# Patient Record
Sex: Female | Born: 1965
Health system: Southern US, Community
[De-identification: ages and names within clinical notes are randomized; demographics above are authoritative.]

## PROBLEM LIST (undated history)

## (undated) DIAGNOSIS — F419 Anxiety disorder, unspecified: Secondary | ICD-10-CM

## (undated) DIAGNOSIS — S3992XA Unspecified injury of lower back, initial encounter: Secondary | ICD-10-CM

## (undated) DIAGNOSIS — F32A Depression, unspecified: Secondary | ICD-10-CM

## (undated) DIAGNOSIS — I499 Cardiac arrhythmia, unspecified: Secondary | ICD-10-CM

## (undated) DIAGNOSIS — E162 Hypoglycemia, unspecified: Secondary | ICD-10-CM

## (undated) HISTORY — PX: ABDOMINAL HYSTERECTOMY: SHX81

## (undated) HISTORY — PX: OTHER SURGICAL HISTORY: SHX169

## (undated) HISTORY — PX: COLECTOMY: SHX59

---

## 1997-11-03 ENCOUNTER — Ambulatory Visit (HOSPITAL_COMMUNITY): Admission: RE | Admit: 1997-11-03 | Discharge: 1997-11-03 | Payer: Self-pay | Admitting: Family Medicine

## 1998-01-06 ENCOUNTER — Emergency Department (HOSPITAL_COMMUNITY): Admission: EM | Admit: 1998-01-06 | Discharge: 1998-01-06 | Payer: Self-pay | Admitting: Emergency Medicine

## 1998-01-14 ENCOUNTER — Ambulatory Visit (HOSPITAL_COMMUNITY): Admission: RE | Admit: 1998-01-14 | Discharge: 1998-01-14 | Payer: Self-pay | Admitting: *Deleted

## 1998-01-28 ENCOUNTER — Inpatient Hospital Stay (HOSPITAL_COMMUNITY): Admission: RE | Admit: 1998-01-28 | Discharge: 1998-01-31 | Payer: Self-pay | Admitting: *Deleted

## 1999-09-21 ENCOUNTER — Encounter: Payer: Self-pay | Admitting: *Deleted

## 1999-09-21 ENCOUNTER — Encounter: Admission: RE | Admit: 1999-09-21 | Discharge: 1999-09-21 | Payer: Self-pay | Admitting: *Deleted

## 2000-08-05 ENCOUNTER — Encounter: Payer: Self-pay | Admitting: Emergency Medicine

## 2000-08-05 ENCOUNTER — Emergency Department (HOSPITAL_COMMUNITY): Admission: EM | Admit: 2000-08-05 | Discharge: 2000-08-05 | Payer: Self-pay | Admitting: Emergency Medicine

## 2000-10-31 ENCOUNTER — Encounter: Payer: Self-pay | Admitting: Internal Medicine

## 2000-10-31 ENCOUNTER — Encounter: Admission: RE | Admit: 2000-10-31 | Discharge: 2000-10-31 | Payer: Self-pay | Admitting: Internal Medicine

## 2004-07-05 ENCOUNTER — Emergency Department (HOSPITAL_COMMUNITY): Admission: EM | Admit: 2004-07-05 | Discharge: 2004-07-06 | Payer: Self-pay | Admitting: Emergency Medicine

## 2007-10-07 ENCOUNTER — Emergency Department (HOSPITAL_COMMUNITY): Admission: EM | Admit: 2007-10-07 | Discharge: 2007-10-07 | Payer: Self-pay | Admitting: Emergency Medicine

## 2007-10-09 ENCOUNTER — Emergency Department (HOSPITAL_COMMUNITY): Admission: EM | Admit: 2007-10-09 | Discharge: 2007-10-09 | Payer: Self-pay | Admitting: Emergency Medicine

## 2008-05-26 ENCOUNTER — Emergency Department (HOSPITAL_COMMUNITY): Admission: EM | Admit: 2008-05-26 | Discharge: 2008-05-26 | Payer: Self-pay | Admitting: Family Medicine

## 2008-08-31 ENCOUNTER — Ambulatory Visit: Payer: Self-pay | Admitting: Radiology

## 2008-08-31 ENCOUNTER — Emergency Department (HOSPITAL_BASED_OUTPATIENT_CLINIC_OR_DEPARTMENT_OTHER): Admission: EM | Admit: 2008-08-31 | Discharge: 2008-08-31 | Payer: Self-pay | Admitting: Emergency Medicine

## 2008-10-16 ENCOUNTER — Emergency Department (HOSPITAL_COMMUNITY): Admission: EM | Admit: 2008-10-16 | Discharge: 2008-10-16 | Payer: Self-pay | Admitting: Emergency Medicine

## 2009-01-28 ENCOUNTER — Emergency Department (HOSPITAL_COMMUNITY): Admission: EM | Admit: 2009-01-28 | Discharge: 2009-01-28 | Payer: Self-pay | Admitting: Family Medicine

## 2009-06-01 ENCOUNTER — Encounter: Admission: RE | Admit: 2009-06-01 | Discharge: 2009-06-01 | Payer: Self-pay | Admitting: Specialist

## 2009-07-18 ENCOUNTER — Emergency Department (HOSPITAL_BASED_OUTPATIENT_CLINIC_OR_DEPARTMENT_OTHER): Admission: EM | Admit: 2009-07-18 | Discharge: 2009-07-18 | Payer: Self-pay | Admitting: Emergency Medicine

## 2010-07-10 IMAGING — CR DG CHEST 2V
2 series · 2 of 2 positions shown · non-contrast
Comparison: No comparison studies available.

CLINICAL DATA: Status and cough.  Fever.

CHEST - 2 VIEW

[view not recorded (1 of 2)]
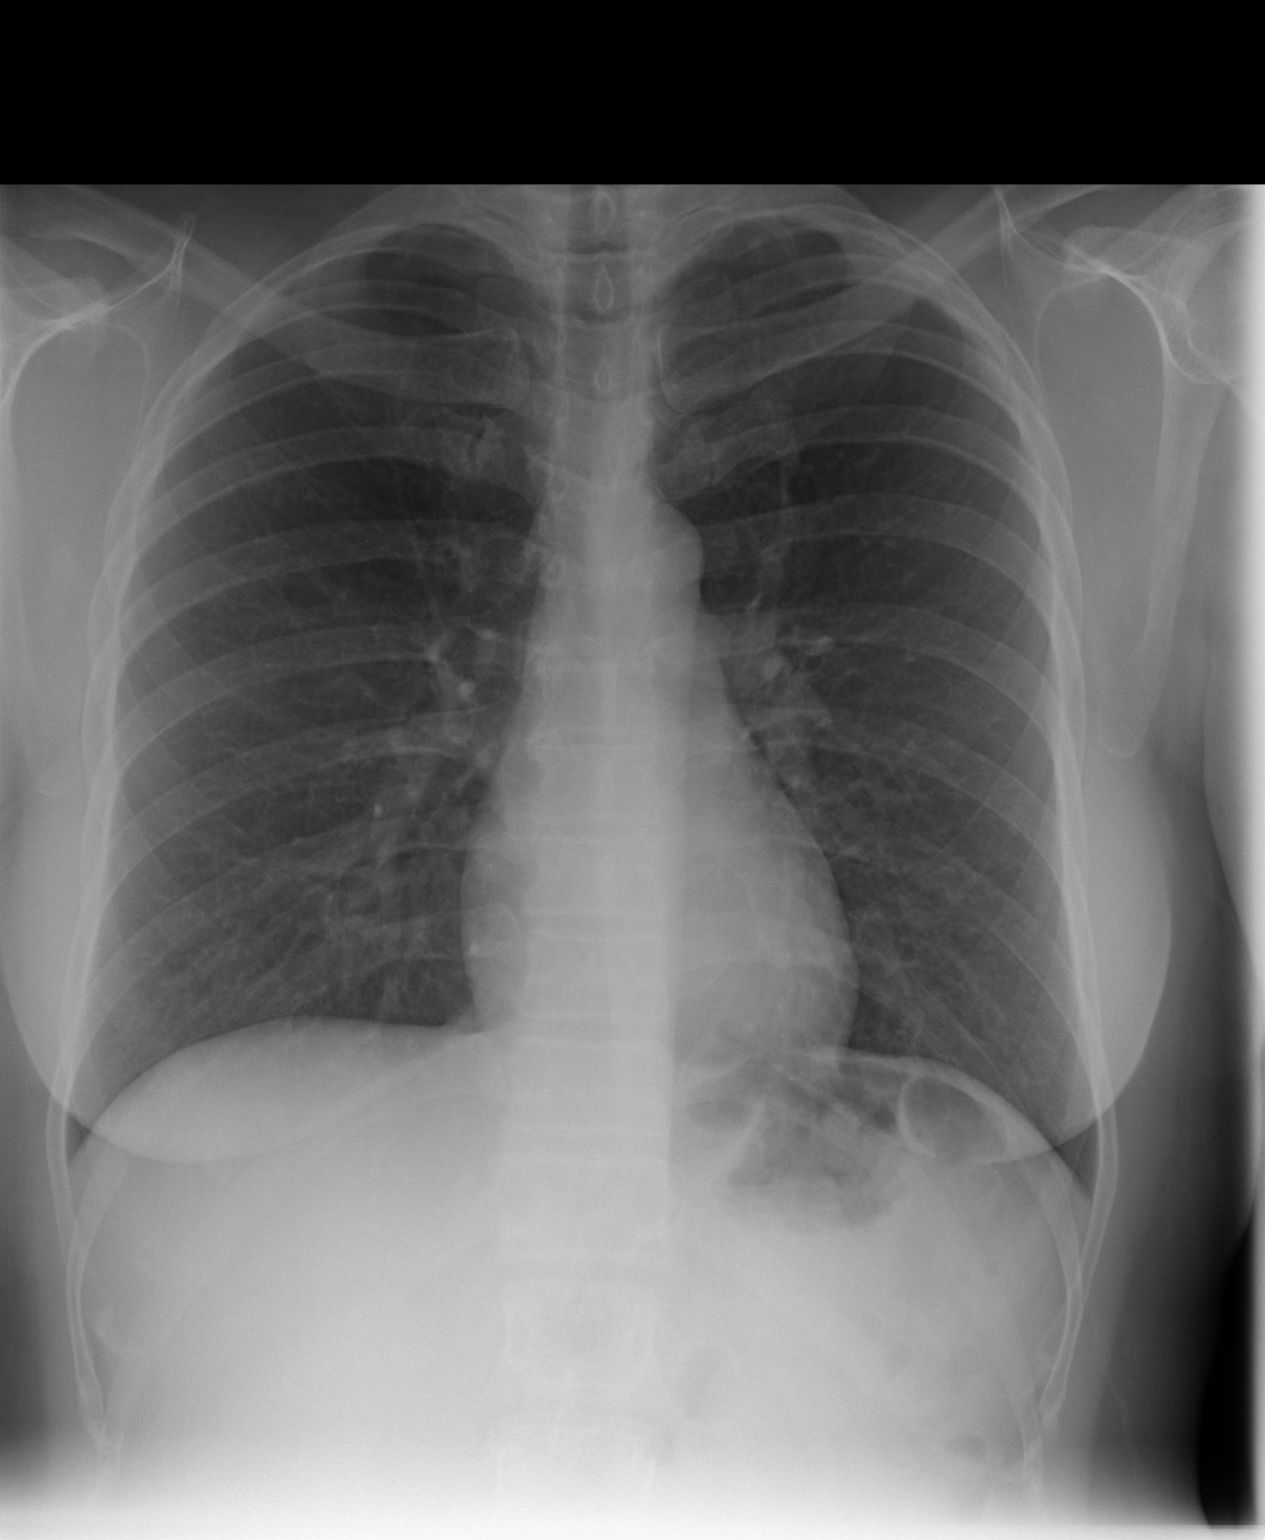

[view not recorded (2 of 2)]
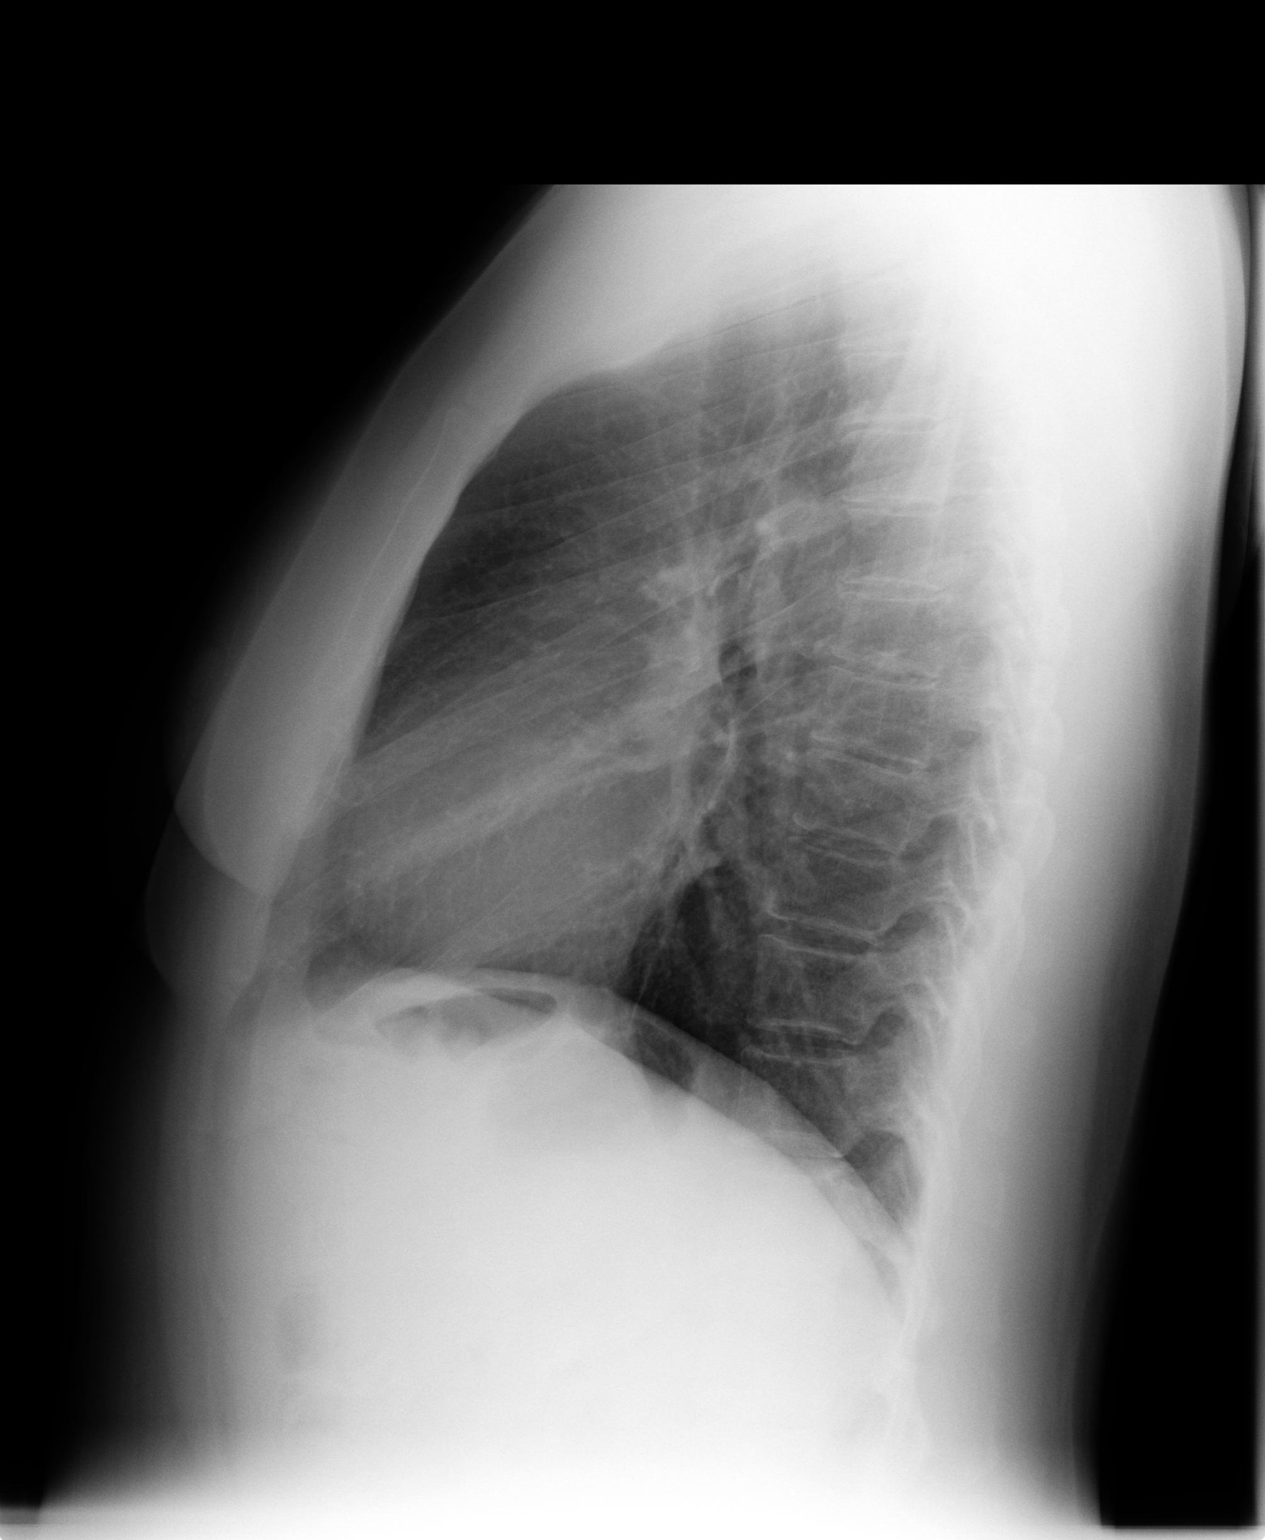

[2 of 2 positions shown; findings below may reference images not displayed]

FINDINGS: The lungs are clear without focal consolidation, edema,
effusion or pneumothorax.  Cardiopericardial silhouette is within
normal limits for size.  Imaged bony structures of the thorax are
intact. Tiny cervical ribs are seen bilaterally at C7.
IMPRESSION: No acute cardiopulmonary process.

## 2010-10-25 LAB — GC/CHLAMYDIA PROBE AMP, GENITAL
Chlamydia, DNA Probe: NEGATIVE
GC Probe Amp, Genital: NEGATIVE

## 2010-10-25 LAB — URINE CULTURE

## 2010-10-25 LAB — WET PREP, GENITAL
Clue Cells Wet Prep HPF POC: NONE SEEN
Trich, Wet Prep: NONE SEEN

## 2010-10-25 LAB — POCT URINALYSIS DIP (DEVICE)
Ketones, ur: NEGATIVE mg/dL
Protein, ur: NEGATIVE mg/dL
Specific Gravity, Urine: 1.02 (ref 1.005–1.030)

## 2010-10-28 ENCOUNTER — Emergency Department (INDEPENDENT_AMBULATORY_CARE_PROVIDER_SITE_OTHER): Payer: BLUE CROSS/BLUE SHIELD

## 2010-10-28 ENCOUNTER — Emergency Department (HOSPITAL_BASED_OUTPATIENT_CLINIC_OR_DEPARTMENT_OTHER)
Admission: EM | Admit: 2010-10-28 | Discharge: 2010-10-28 | Disposition: A | Discharge status: Home / Self Care | Payer: BLUE CROSS/BLUE SHIELD | Attending: Emergency Medicine | Admitting: Emergency Medicine

## 2010-10-28 DIAGNOSIS — G8929 Other chronic pain: Secondary | ICD-10-CM | POA: Insufficient documentation

## 2010-10-28 DIAGNOSIS — W19XXXA Unspecified fall, initial encounter: Secondary | ICD-10-CM

## 2010-10-28 DIAGNOSIS — M25519 Pain in unspecified shoulder: Secondary | ICD-10-CM | POA: Insufficient documentation

## 2010-11-02 LAB — DIFFERENTIAL
Eosinophils Absolute: 0.1 10*3/uL (ref 0.0–0.7)
Eosinophils Relative: 1 % (ref 0–5)
Lymphs Abs: 2.6 10*3/uL (ref 0.7–4.0)
Monocytes Absolute: 0.4 10*3/uL (ref 0.1–1.0)
Monocytes Relative: 6 % (ref 3–12)

## 2010-11-02 LAB — BASIC METABOLIC PANEL
Chloride: 106 mEq/L (ref 96–112)
GFR calc Af Amer: >60 mL/min (ref >60)
Potassium: 4.1 mEq/L (ref 3.5–5.1)

## 2010-11-02 LAB — CBC
HCT: 35.8 % — ABNORMAL LOW (ref 36.0–46.0)
MCV: 92.5 fL (ref 78.0–100.0)
RBC: 3.87 MIL/uL (ref 3.87–5.11)
WBC: 6.9 10*3/uL (ref 4.0–10.5)

## 2011-03-22 ENCOUNTER — Encounter: Payer: Self-pay | Admitting: *Deleted

## 2011-03-22 ENCOUNTER — Emergency Department (HOSPITAL_BASED_OUTPATIENT_CLINIC_OR_DEPARTMENT_OTHER)
Admission: EM | Admit: 2011-03-22 | Discharge: 2011-03-23 | Disposition: A | Discharge status: Home / Self Care | Payer: Worker's Compensation | Attending: Emergency Medicine | Admitting: Emergency Medicine

## 2011-03-22 DIAGNOSIS — G8929 Other chronic pain: Secondary | ICD-10-CM | POA: Insufficient documentation

## 2011-03-22 DIAGNOSIS — M549 Dorsalgia, unspecified: Secondary | ICD-10-CM | POA: Insufficient documentation

## 2011-03-22 HISTORY — DX: Unspecified injury of lower back, initial encounter: S39.92XA

## 2011-03-22 MED ORDER — KETOROLAC TROMETHAMINE 30 MG/ML IJ SOLN
60.0000 mg | Freq: Once | INTRAMUSCULAR | Status: AC
  Administered 2011-03-22: 60 mg via INTRAMUSCULAR
  Filled 2011-03-22: qty 2

## 2011-03-22 MED ORDER — DIAZEPAM 5 MG/ML IJ SOLN
10.0000 mg | Freq: Once | INTRAMUSCULAR | Status: DC
  Filled 2011-03-22: qty 2

## 2011-03-22 MED ORDER — HYDROMORPHONE HCL 2 MG/ML IJ SOLN
2.0000 mg | Freq: Once | INTRAMUSCULAR | Status: AC
  Administered 2011-03-22: 2 mg via INTRAMUSCULAR
  Filled 2011-03-22: qty 1

## 2011-03-22 NOTE — ED Notes (Signed)
Patient given ice chips with permission from assigned RN

## 2011-03-22 NOTE — ED Notes (Addendum)
Pt c/o back pain after lifting laundry basket today, tramadol not helping with pain. Pt has previous worker's comp back injury which she is seeing a spinal MD for but re-injured it again today. Denies incontinence, able to move legs

## 2011-03-22 NOTE — ED Provider Notes (Addendum)
History     CSN: [id number] Arrival date & time: 03/22/2011  8:56 PM  Chief Complaint  Patient presents with  . Back Pain   HPI Pt with history of chronic back pain from fall at work several months ago reports increased pain today after lifting and twisting with a laundry basket. Complaining of severe aching bilateral low back pain, worse with movement. No radiation into legs, no incontinence, no falls or trauma.  Past Medical History  Diagnosis Date  . Back injury     History reviewed. No pertinent past surgical history.  History reviewed. No pertinent family history.  History  Substance Use Topics  . Smoking status: Never Smoker   . Smokeless tobacco: Not on file  . Alcohol Use: No    OB History    Grav Para Term Preterm Abortions TAB SAB Ect Mult Living                  Review of Systems All other systems reviewed and are negative except as noted in HPI.   Physical Exam  BP 127/85  Pulse 99  Temp(Src) 98.7 F (37.1 C) (Oral)  Resp 22  Ht 5\' 8"  (1.727 m)  Wt 220 lb (99.791 kg)  BMI 33.45 kg/m2  SpO2 100%  Physical Exam  Nursing note and vitals reviewed. Constitutional: She is oriented to person, place, and time. She appears well-developed and well-nourished.  HENT:  Head: Normocephalic and atraumatic.  Eyes: EOM are normal. Pupils are equal, round, and reactive to light.  Neck: Normal range of motion. Neck supple.  Cardiovascular: Normal rate, normal heart sounds and intact distal pulses.   Pulmonary/Chest: Effort normal and breath sounds normal.  Abdominal: Bowel sounds are normal. She exhibits no distension. There is no tenderness.  Musculoskeletal: Normal range of motion. She exhibits no edema.       Lumbar paraspinal tenderness/spasm  Neurological: She is alert and oriented to person, place, and time. No cranial nerve deficit.  Skin: Skin is warm and dry. No rash noted.  Psychiatric: She has a normal mood and affect.    ED Course   Procedures  MDM IM meds for pain then will reassess.     12:02 AM Pain improved some, but patient is very tense and not relaxing. Advised her to relax and lay back to help muscle spasm. Offered valium for additional relief, but family is concerned about further sedating her. Will d/c with family at bedside and advised to rest and continue pain medications at home.   Charles B. Bernette Mayers, MD 03/23/11 0003  Addendum: Pt unable to sit up without getting nauseated and diaphoretic. She was given PO Zofran and allowed to stay in the ED for several hours, but still vomiting with sitting/standing. Will place IV, give IV meds and fluids.   Charles B. Bernette Mayers, MD 03/23/11 [phone]

## 2011-03-23 MED ORDER — SODIUM CHLORIDE 0.9 % IV SOLN
Freq: Once | INTRAVENOUS | Status: AC
  Administered 2011-03-23: 04:00:00 via INTRAVENOUS

## 2011-03-23 MED ORDER — OXYCODONE-ACETAMINOPHEN 5-325 MG PO TABS: 1.0000 | ORAL_TABLET | Freq: Four times a day (QID) | ORAL | Status: AC | PRN

## 2011-03-23 MED ORDER — ONDANSETRON 8 MG PO TBDP
8.0000 mg | ORAL_TABLET | Freq: Once | ORAL | Status: AC
Start: 2011-03-23 — End: 2011-03-23
  Administered 2011-03-23: 8 mg via ORAL

## 2011-03-23 MED ORDER — CYCLOBENZAPRINE HCL 10 MG PO TABS: 10.0000 mg | ORAL_TABLET | Freq: Two times a day (BID) | ORAL | Status: AC | PRN

## 2011-03-23 MED ORDER — IBUPROFEN 800 MG PO TABS: 800.0000 mg | ORAL_TABLET | Freq: Three times a day (TID) | ORAL | Status: AC

## 2011-03-23 MED ORDER — ONDANSETRON 8 MG PO TBDP
ORAL_TABLET | ORAL | Status: AC
  Administered 2011-03-23: 8 mg
  Filled 2011-03-23: qty 1

## 2011-03-23 MED ORDER — ONDANSETRON HCL 4 MG/2ML IJ SOLN
INTRAMUSCULAR | Status: AC
  Administered 2011-03-23: 4 mg via INTRAVENOUS
  Filled 2011-03-23: qty 2

## 2011-03-23 NOTE — ED Notes (Signed)
patietns family member to nurses station asking to help get patient in a wheelchair to go home. Assigned RN advised she could leave if she could sit up without vomiting and diaphoresis. When patient say up to get out of bed, she became slightly diaphoretic and began to vomit. RN witnessed. Patient advised and agrees to lay back down. Assigned RN aware.

## 2011-03-23 NOTE — ED Notes (Signed)
No further vomiting noted, pt placed in w/c and placed in vehicle with family for transport home.

## 2011-03-23 NOTE — ED Notes (Signed)
Pt was only given one dose of 8mg  Zofran ODT per MD order.

## 2011-03-23 NOTE — ED Notes (Signed)
Upon reassessment of pt, pt is no longer c/o tremendous pain. Pt is slurring her words and son requests that no more meds be given since she has to stay at home alone. MD made aware.

## 2011-03-23 NOTE — ED Notes (Signed)
Upon sitting pt up for d/c home she became very diaphoretic, dizzy, and clammy. Pt laid back down in bed, cool cloth applied, and SR up x2. Family st bs, resps even and unlabored.

## 2011-03-23 NOTE — ED Notes (Signed)
Patient given crackers and ginger ale. Instructed to sit up while eating and drinking. Patient verbalizes understanding.

## 2011-03-23 NOTE — ED Notes (Signed)
Pt once again began vomiting after being sat up and prepped for d/c. MD made aware, new orders rec'd and carried out. Pt's family at bs, SR up x2.

## 2011-03-23 NOTE — ED Notes (Signed)
Pt again became diaphoretic and dizzy upon sitting up. Pt vomited x3, then assisted back into bed with head elevated. MD made aware, new orders rec'd.

## 2011-04-11 LAB — POCT URINALYSIS DIP (DEVICE)
Protein, ur: NEGATIVE
Specific Gravity, Urine: 1.02
Urobilinogen, UA: 0.2

## 2011-07-15 ENCOUNTER — Emergency Department (HOSPITAL_COMMUNITY)
Admission: EM | Admit: 2011-07-15 | Discharge: 2011-07-15 | Disposition: A | Discharge status: Home / Self Care | Payer: BC Managed Care – PPO | Source: Home / Self Care

## 2011-07-15 ENCOUNTER — Encounter (HOSPITAL_COMMUNITY): Payer: Self-pay | Admitting: *Deleted

## 2011-07-15 DIAGNOSIS — N390 Urinary tract infection, site not specified: Secondary | ICD-10-CM

## 2011-07-15 DIAGNOSIS — J209 Acute bronchitis, unspecified: Secondary | ICD-10-CM

## 2011-07-15 DIAGNOSIS — J040 Acute laryngitis: Secondary | ICD-10-CM

## 2011-07-15 LAB — POCT URINALYSIS DIP (DEVICE)
Bilirubin Urine: NEGATIVE
Nitrite: NEGATIVE
pH: 5.5 (ref 5.0–8.0)

## 2011-07-15 MED ORDER — PREDNISONE 20 MG PO TABS: 20.0000 mg | ORAL_TABLET | Freq: Every day | ORAL | Status: AC

## 2011-07-15 MED ORDER — GUAIFENESIN-CODEINE 100-10 MG/5ML PO SYRP: ORAL_SOLUTION | ORAL | Status: AC

## 2011-07-15 MED ORDER — CEPHALEXIN 500 MG PO CAPS: 500.0000 mg | ORAL_CAPSULE | Freq: Three times a day (TID) | ORAL | Status: AC

## 2011-07-15 MED ORDER — ALBUTEROL SULFATE HFA 108 (90 BASE) MCG/ACT IN AERS: 2.0000 | INHALATION_SPRAY | RESPIRATORY_TRACT | Status: DC | PRN

## 2011-07-15 NOTE — ED Notes (Signed)
pT  HAS  2 COMPLAINTS     SHE  REPORTS  SYMPTOMS  OF      COUGH  CONGESTION    SORETHROAT       AS  WELL  AS    BEING  HOARSE      SYMPTOMS   X  11  DAYS  -  SHE  ALSO  REPORTS  SYMPTOMS     STRONG  URINE  AS  WELL  X  2  DAYS

## 2011-07-15 NOTE — ED Provider Notes (Signed)
Medical screening examination/treatment/procedure(s) were performed by non-physician practitioner and as supervising physician I was immediately available for consultation/collaboration.  Raynald Blend, MD 07/15/11 [phone]

## 2011-07-15 NOTE — ED Provider Notes (Signed)
History     CSN: [id number]  Arrival date & time 07/15/11  0940   None     Chief Complaint  Patient presents with  . Cough    (Consider location/radiation/quality/duration/timing/severity/associated sxs/prior treatment) HPI Comments: Pt states she has had a nonproductive cough, chest congestion and hoarse voice x 11 days. Mild nasal congestion. No fever, dyspnea or wheezing. Cough is disruptive to sleep even with taking otc cough medications. Yesterday she began to notice her urine having a strong odor. She denies dysuria, urgency, frequency, abd or back pain.     Past Medical History  Diagnosis Date  . Back injury     Past Surgical History  Procedure Date  . Abdominal hysterectomy   . Btl     History reviewed. No pertinent family history.  History  Substance Use Topics  . Smoking status: Never Smoker   . Smokeless tobacco: Not on file  . Alcohol Use: No    OB History    Grav Para Term Preterm Abortions TAB SAB Ect Mult Living                  Review of Systems  Constitutional: Negative for fever, chills and fatigue.  HENT: Positive for congestion. Negative for ear pain, sore throat, rhinorrhea, sneezing, postnasal drip and sinus pressure.   Respiratory: Positive for cough. Negative for shortness of breath and wheezing.   Cardiovascular: Negative for chest pain and palpitations.  Gastrointestinal: Negative for abdominal pain.  Genitourinary: Negative for dysuria, urgency and frequency.  Musculoskeletal: Negative for back pain.    Allergies  Shrimp and Penicillins  Home Medications   Current Outpatient Rx  Name Route Sig Dispense Refill  . ALBUTEROL SULFATE HFA 108 (90 BASE) MCG/ACT IN AERS Inhalation Inhale 2 puffs into the lungs every 4 (four) hours as needed for wheezing. 1 Inhaler 0  . AMITRIPTYLINE HCL 100 MG PO TABS Oral Take 100 mg by mouth at bedtime.      . CEPHALEXIN 500 MG PO CAPS Oral Take 1 capsule (500 mg total) by mouth 3 (three) times  daily. 21 capsule 0  . GUAIFENESIN-CODEINE 100-10 MG/5ML PO SYRP  1-2 tsp every 6 hrs prn cough 120 mL 0  . MELOXICAM 15 MG PO TABS Oral Take 15 mg by mouth daily.      Marland Kitchen ONE-DAILY MULTI VITAMINS PO TABS Oral Take 1 tablet by mouth daily.      Marland Kitchen PREDNISONE 20 MG PO TABS Oral Take 1 tablet (20 mg total) by mouth daily. 5 tablet 0  . TRAMADOL HCL 50 MG PO TABS Oral Take 50 mg by mouth every 6 (six) hours as needed. pain       BP 144/81  Pulse 102  Temp(Src) 98.3 F (36.8 C) (Oral)  Resp 20  SpO2 98%  Physical Exam  Nursing note and vitals reviewed. Constitutional: She appears well-developed and well-nourished. No distress.  HENT:  Head: Normocephalic and atraumatic.  Right Ear: Tympanic membrane, external ear and ear canal normal.  Left Ear: Tympanic membrane, external ear and ear canal normal.  Nose: Nose normal.  Mouth/Throat: Uvula is midline, oropharynx is clear and moist and mucous membranes are normal. No oropharyngeal exudate, posterior oropharyngeal edema or posterior oropharyngeal erythema.       Hoarse voice noted.  Neck: Neck supple.  Cardiovascular: Normal rate, regular rhythm and normal heart sounds.   Pulmonary/Chest: Effort normal and breath sounds normal. No respiratory distress.  Abdominal: Soft. Bowel sounds are normal. She  exhibits no mass. There is no hepatosplenomegaly. There is no tenderness. There is no guarding and no CVA tenderness.  Lymphadenopathy:    She has no cervical adenopathy.  Neurological: She is alert.  Skin: Skin is warm and dry.  Psychiatric: She has a normal mood and affect.    ED Course  Procedures (including critical care time)  Labs Reviewed  POCT URINALYSIS DIP (DEVICE) - Abnormal; Notable for the following:    Hgb urine dipstick MODERATE (*)    Leukocytes, UA SMALL (*) Biochemical Testing Only. Please order routine urinalysis from main lab if confirmatory testing is needed.   All other components within normal limits   No results  found.   1. Acute bronchitis   2. Laryngitis acute   3. Acute UTI       MDM   UA pos.        Melody Comas, [place] 07/15/11 [phone]

## 2011-11-15 ENCOUNTER — Encounter (HOSPITAL_BASED_OUTPATIENT_CLINIC_OR_DEPARTMENT_OTHER): Payer: Self-pay | Admitting: Emergency Medicine

## 2011-11-15 ENCOUNTER — Emergency Department (INDEPENDENT_AMBULATORY_CARE_PROVIDER_SITE_OTHER): Payer: BC Managed Care – PPO

## 2011-11-15 ENCOUNTER — Emergency Department (HOSPITAL_BASED_OUTPATIENT_CLINIC_OR_DEPARTMENT_OTHER)
Admission: EM | Admit: 2011-11-15 | Discharge: 2011-11-16 | Disposition: A | Discharge status: Home / Self Care | Payer: BC Managed Care – PPO | Attending: Emergency Medicine | Admitting: Emergency Medicine

## 2011-11-15 DIAGNOSIS — R1013 Epigastric pain: Secondary | ICD-10-CM | POA: Insufficient documentation

## 2011-11-15 DIAGNOSIS — R0602 Shortness of breath: Secondary | ICD-10-CM | POA: Insufficient documentation

## 2011-11-15 DIAGNOSIS — R079 Chest pain, unspecified: Secondary | ICD-10-CM | POA: Insufficient documentation

## 2011-11-15 DIAGNOSIS — Z79899 Other long term (current) drug therapy: Secondary | ICD-10-CM | POA: Insufficient documentation

## 2011-11-15 LAB — CBC
HCT: 33.7 % — ABNORMAL LOW (ref 36.0–46.0)
MCHC: 35.6 g/dL (ref 30.0–36.0)
MCV: 87.8 fL (ref 78.0–100.0)
RDW: 12.1 % (ref 11.5–15.5)

## 2011-11-15 LAB — BASIC METABOLIC PANEL
BUN: 14 mg/dL (ref 6–23)
Calcium: 10.1 mg/dL (ref 8.4–10.5)
Creatinine, Ser: 0.8 mg/dL (ref 0.50–1.10)
GFR calc Af Amer: >90 mL/min (ref >90)
GFR calc non Af Amer: 87 mL/min — ABNORMAL LOW (ref >90)

## 2011-11-15 MED ORDER — ASPIRIN 81 MG PO CHEW
324.0000 mg | CHEWABLE_TABLET | Freq: Once | ORAL | Status: AC
  Administered 2011-11-15: 324 mg via ORAL
  Filled 2011-11-15: qty 3
  Filled 2011-11-15: qty 1

## 2011-11-15 NOTE — ED Provider Notes (Signed)
History     CSN: [id number]  Arrival date & time 11/15/11  2112   First MD Initiated Contact with Patient 11/15/11 2258      Chief Complaint  Patient presents with  . Chest Pain    (Consider location/radiation/quality/duration/timing/severity/associated sxs/prior treatment) Patient is a 46 y.o. female presenting with chest pain. The history is provided by the patient.  Chest Pain The chest pain began 12 - 24 hours ago. Duration of episode(s) is 12 hours. Chest pain occurs constantly. The chest pain is improving. Associated with: nothing. The severity of the pain is moderate. The quality of the pain is described as pressure-like. The pain does not radiate. Exacerbated by: nothing. Primary symptoms include shortness of breath. Pertinent negatives for primary symptoms include no fever, no syncope, no cough, no palpitations and no vomiting.  Pertinent negatives for associated symptoms include no diaphoresis. She tried nothing for the symptoms.   she reports she has never had this before No known h/o CAD/PE Pain is not pleuritic Reports it started after drinking a juice Now feeling improved No syncope reported  Past Medical History  Diagnosis Date  . Back injury     Past Surgical History  Procedure Date  . Abdominal hysterectomy   . Btl    Fam history - CAD  History  Substance Use Topics  . Smoking status: Never Smoker   . Smokeless tobacco: Not on file  . Alcohol Use: No    OB History    Grav Para Term Preterm Abortions TAB SAB Ect Mult Living                  Review of Systems  Constitutional: Negative for fever and diaphoresis.  Respiratory: Positive for shortness of breath. Negative for cough.   Cardiovascular: Positive for chest pain. Negative for palpitations and syncope.  Gastrointestinal: Negative for vomiting.  All other systems reviewed and are negative.    Allergies  Shrimp and Penicillins  Home Medications   Current Outpatient Rx  Name Route  Sig Dispense Refill  . ALBUTEROL SULFATE HFA 108 (90 BASE) MCG/ACT IN AERS Inhalation Inhale 2 puffs into the lungs every 4 (four) hours as needed for wheezing. 1 Inhaler 0  . HAIR VITAMINS PO Oral Take 2 tablets by mouth daily.    . OXYCODONE-ACETAMINOPHEN 5-325 MG PO TABS Oral Take 0.5 tablets by mouth 2 (two) times daily as needed. For pain      BP 122/75  Pulse 87  Temp(Src) 97.9 F (36.6 C) (Oral)  Resp 18  SpO2 100% BP 95/60  Pulse 85  Temp(Src) 97.9 F (36.6 C) (Oral)  Resp 18  SpO2 100%  Physical Exam CONSTITUTIONAL: Well developed/well nourished HEAD AND FACE: Normocephalic/atraumatic EYES: EOMI/PERRL ENMT: Mucous membranes moist NECK: supple no meningeal signs SPINE:entire spine nontender CV: S1/S2 noted, no murmurs/rubs/gallops noted LUNGS: Lungs are clear to auscultation bilaterally, no apparent distress ABDOMEN: soft, nontender, no rebound or guarding GU:no cva tenderness NEURO: Pt is awake/alert, moves all extremitiesx4 EXTREMITIES: pulses normal, full ROM, no edema SKIN: warm, color normal PSYCH: no abnormalities of mood noted  ED Course  Procedures Labs Reviewed  CBC - Abnormal; Notable for the following:    RBC 3.84 (*)    HCT 33.7 (*)    All other components within normal limits  BASIC METABOLIC PANEL - Abnormal; Notable for the following:    Glucose, Bld 110 (*)    GFR calc non Af Amer 87 (*)    All other components  within normal limits  TROPONIN I  TROPONIN I   Dg Chest 2 View  11/15/2011  *RADIOLOGY REPORT*  Clinical Data: Chest pain, shortness of breath, epigastric pain  CHEST - 2 VIEW  Comparison: 07/05/2004  Findings: Normal cardiac silhouette and mediastinal contours.  No focal parenchymal opacities.  No pleural effusion or pneumothorax. No acute osseous abnormalities.  IMPRESSION: No acute cardiopulmonary disease.  Original Report Authenticated By: Waynard Reeds, M.D.   11:56 PM HEART score less than 3 EKG unremarkable Will check two  separate troponins and if negative safe for d/c and will defer admission/provocative imaging  2:18 AM Pt pain free, no distress Suspicion for ACS/PE/Dissection or other acute event is low Discussed strict return precautions  The patient appears reasonably screened and/or stabilized for discharge and I doubt any other medical condition or other [facility] requiring further screening, evaluation, or treatment in the ED at this time prior to discharge.    MDM  Nursing notes reviewed and considered in documentation xrays reviewed and considered All labs/vitals reviewed and considered        Date: 11/15/2011  Rate: 85  Rhythm: normal sinus rhythm  QRS Axis: normal  Intervals: normal  ST/T Wave abnormalities: normal  Conduction Disutrbances:none      Joya Gaskins, MD 11/16/11 [phone]

## 2011-11-15 NOTE — ED Notes (Signed)
Substernal cp started today around 10 am radiates to throat

## 2011-11-16 NOTE — Discharge Instructions (Signed)

## 2012-04-11 ENCOUNTER — Other Ambulatory Visit: Payer: Self-pay | Admitting: Orthopaedic Surgery

## 2012-04-11 DIAGNOSIS — M545 Low back pain, unspecified: Secondary | ICD-10-CM

## 2012-04-17 ENCOUNTER — Other Ambulatory Visit: Payer: Self-pay

## 2012-05-17 ENCOUNTER — Emergency Department (HOSPITAL_BASED_OUTPATIENT_CLINIC_OR_DEPARTMENT_OTHER)
Admission: EM | Admit: 2012-05-17 | Discharge: 2012-05-17 | Disposition: A | Discharge status: Home / Self Care | Payer: BC Managed Care – PPO | Attending: Emergency Medicine | Admitting: Emergency Medicine

## 2012-05-17 ENCOUNTER — Other Ambulatory Visit: Payer: Self-pay

## 2012-05-17 ENCOUNTER — Encounter (HOSPITAL_BASED_OUTPATIENT_CLINIC_OR_DEPARTMENT_OTHER): Payer: Self-pay | Admitting: *Deleted

## 2012-05-17 DIAGNOSIS — T6191XA Toxic effect of unspecified seafood, accidental (unintentional), initial encounter: Secondary | ICD-10-CM | POA: Insufficient documentation

## 2012-05-17 DIAGNOSIS — Y9389 Activity, other specified: Secondary | ICD-10-CM | POA: Insufficient documentation

## 2012-05-17 DIAGNOSIS — L290 Pruritus ani: Secondary | ICD-10-CM | POA: Insufficient documentation

## 2012-05-17 DIAGNOSIS — T61781A Other shellfish poisoning, accidental (unintentional), initial encounter: Secondary | ICD-10-CM | POA: Insufficient documentation

## 2012-05-17 DIAGNOSIS — T7840XA Allergy, unspecified, initial encounter: Secondary | ICD-10-CM

## 2012-05-17 DIAGNOSIS — Y929 Unspecified place or not applicable: Secondary | ICD-10-CM | POA: Insufficient documentation

## 2012-05-17 MED ORDER — PREDNISONE 50 MG PO TABS: 50.0000 mg | ORAL_TABLET | Freq: Every day | ORAL | Status: DC

## 2012-05-17 MED ORDER — FAMOTIDINE 20 MG PO TABS
40.0000 mg | ORAL_TABLET | Freq: Once | ORAL | Status: AC
  Administered 2012-05-17: 40 mg via ORAL
  Filled 2012-05-17 (×2): qty 1

## 2012-05-17 MED ORDER — FAMOTIDINE 20 MG PO TABS: 20.0000 mg | ORAL_TABLET | Freq: Two times a day (BID) | ORAL | Status: DC

## 2012-05-17 MED ORDER — EPINEPHRINE 0.3 MG/0.3ML IJ DEVI: 0.3000 mg | INTRAMUSCULAR | Status: DC | PRN

## 2012-05-17 MED ORDER — DEXAMETHASONE SODIUM PHOSPHATE 10 MG/ML IJ SOLN
10.0000 mg | Freq: Once | INTRAMUSCULAR | Status: AC
  Administered 2012-05-17: 10 mg via INTRAMUSCULAR
  Filled 2012-05-17: qty 1

## 2012-05-17 NOTE — ED Notes (Signed)
Pt c/o itchy rash that woke from sleep after eating crab cakes for dinner

## 2012-05-17 NOTE — ED Notes (Signed)
Pt states that she took allergy medicine PTA.

## 2012-05-17 NOTE — ED Provider Notes (Signed)
History     CSN: [id number]  Arrival date & time 05/17/12  0110   First MD Initiated Contact with Patient 05/17/12 0123      Chief Complaint  Patient presents with  . Rash    (Consider location/radiation/quality/duration/timing/severity/associated sxs/prior treatment) Patient is a 46 y.o. female presenting with rash and allergic reaction. The history is provided by the patient. No language interpreter was used.  Rash  This is a new problem. The current episode started 1 to 2 hours ago. The problem has not changed since onset.Associated with: ate crab cakes. There has been no fever. Affected Location: arms and abdomen. The patient is experiencing no pain. Associated symptoms include itching. She has tried nothing for the symptoms. The treatment provided no relief. Risk factors include new environmental exposures.  Allergic Reaction The primary symptoms are  rash. The primary symptoms do not include wheezing, shortness of breath, dizziness, palpitations or angioedema. The current episode started 1 to 2 hours ago. The problem has not changed since onset.This is a new problem.  The rash is associated with itching.  The onset of the reaction was associated with eating. Significant symptoms also include itching. Significant symptoms that are not present include flushing.    Past Medical History  Diagnosis Date  . Back injury     Past Surgical History  Procedure Date  . Abdominal hysterectomy   . Btl     History reviewed. No pertinent family history.  History  Substance Use Topics  . Smoking status: Never Smoker   . Smokeless tobacco: Not on file  . Alcohol Use: No    OB History    Grav Para Term Preterm Abortions TAB SAB Ect Mult Living                  Review of Systems  HENT: Negative for voice change.   Respiratory: Negative for shortness of breath and wheezing.   Cardiovascular: Negative for palpitations.  Skin: Positive for itching and rash. Negative for  flushing.  Neurological: Negative for dizziness.  All other systems reviewed and are negative.    Allergies  Shrimp and Penicillins  Home Medications   Current Outpatient Rx  Name Route Sig Dispense Refill  . ALBUTEROL SULFATE HFA 108 (90 BASE) MCG/ACT IN AERS Inhalation Inhale 2 puffs into the lungs every 4 (four) hours as needed for wheezing. 1 Inhaler 0  . HAIR VITAMINS PO Oral Take 2 tablets by mouth daily.    . OXYCODONE-ACETAMINOPHEN 5-325 MG PO TABS Oral Take 0.5 tablets by mouth 2 (two) times daily as needed. For pain      BP 132/65  Pulse 91  Temp 97.8 F (36.6 C) (Oral)  Resp 18  Ht 5' 8.5" (1.74 m)  Wt 224 lb (101.606 kg)  BMI 33.56 kg/m2  SpO2 100%  Physical Exam  Constitutional: She is oriented to person, place, and time. She appears well-developed and well-nourished. No distress.  HENT:  Head: Normocephalic and atraumatic.  Mouth/Throat: Oropharynx is clear and moist.       No swelling of the lips tongue or uvula  Eyes: Conjunctivae normal are normal. Pupils are equal, round, and reactive to light.  Neck: Normal range of motion. Neck supple.  Cardiovascular: Normal rate and regular rhythm.   Pulmonary/Chest: Effort normal and breath sounds normal. She has no wheezes. She has no rales.  Abdominal: Soft. Bowel sounds are normal. There is no tenderness. There is no rebound and no guarding.  Musculoskeletal: Normal range  of motion.  Neurological: She is alert and oriented to person, place, and time.  Skin: Rash noted.  Psychiatric: She has a normal mood and affect.    ED Course  Procedures (including critical care time)  Labs Reviewed - No data to display No results found.   No diagnosis found.    MDM  No shell fish of any kind. Will prescribe prednisone and pepcid and one epi pen in case of accidental contact with shell fish        Jerrold Haskell K Jose Corvin-Rasch, MD 05/17/12 4782

## 2012-10-11 ENCOUNTER — Emergency Department (HOSPITAL_BASED_OUTPATIENT_CLINIC_OR_DEPARTMENT_OTHER): Payer: BC Managed Care – PPO

## 2012-10-11 ENCOUNTER — Encounter (HOSPITAL_BASED_OUTPATIENT_CLINIC_OR_DEPARTMENT_OTHER): Payer: Self-pay | Admitting: *Deleted

## 2012-10-11 ENCOUNTER — Emergency Department (HOSPITAL_BASED_OUTPATIENT_CLINIC_OR_DEPARTMENT_OTHER)
Admission: EM | Admit: 2012-10-11 | Discharge: 2012-10-11 | Disposition: A | Discharge status: Home / Self Care | Payer: BC Managed Care – PPO | Attending: Emergency Medicine | Admitting: Emergency Medicine

## 2012-10-11 DIAGNOSIS — Z79899 Other long term (current) drug therapy: Secondary | ICD-10-CM | POA: Insufficient documentation

## 2012-10-11 DIAGNOSIS — R0789 Other chest pain: Secondary | ICD-10-CM

## 2012-10-11 DIAGNOSIS — E669 Obesity, unspecified: Secondary | ICD-10-CM | POA: Insufficient documentation

## 2012-10-11 DIAGNOSIS — IMO0002 Reserved for concepts with insufficient information to code with codable children: Secondary | ICD-10-CM | POA: Insufficient documentation

## 2012-10-11 DIAGNOSIS — G8929 Other chronic pain: Secondary | ICD-10-CM | POA: Insufficient documentation

## 2012-10-11 DIAGNOSIS — Z87828 Personal history of other (healed) physical injury and trauma: Secondary | ICD-10-CM | POA: Insufficient documentation

## 2012-10-11 DIAGNOSIS — R071 Chest pain on breathing: Secondary | ICD-10-CM | POA: Insufficient documentation

## 2012-10-11 MED ORDER — IBUPROFEN 800 MG PO TABS: 800.0000 mg | ORAL_TABLET | Freq: Three times a day (TID) | ORAL | Status: DC | PRN

## 2012-10-11 MED ORDER — IBUPROFEN 800 MG PO TABS
800.0000 mg | ORAL_TABLET | Freq: Once | ORAL | Status: AC
  Administered 2012-10-11: 800 mg via ORAL
  Filled 2012-10-11: qty 1

## 2012-10-11 NOTE — ED Notes (Signed)
Pain in her left ribs x 1 week. Hx of chronic back pain x 3 years as a result of injury.

## 2012-10-11 NOTE — ED Provider Notes (Signed)
History     CSN: [id number]  Arrival date & time 10/11/12  1854   First MD Initiated Contact with Patient 10/11/12 1904      Chief Complaint  Patient presents with  . Back Pain    (Consider location/radiation/quality/duration/timing/severity/associated sxs/prior treatment) Patient is a 47 y.o. female presenting with back pain.  Back Pain  Complains of left-sided rib pain for one week. Treated with ibuprofen with partial relief has also taking Opana for back pain without relief. She denies shortness of breath denies nausea denies fever denies injury no other complaint. Pain is worse with changing position. Improved with remaining still. Pain is moderate to severe no abdominal pain no headache Past Medical History  Diagnosis Date  . Back injury     Past Surgical History  Procedure Laterality Date  . Abdominal hysterectomy    . Btl      No family history on file.  History  Substance Use Topics  . Smoking status: Never Smoker   . Smokeless tobacco: Not on file  . Alcohol Use: No    OB History   Grav Para Term Preterm Abortions TAB SAB Ect Mult Living                  Review of Systems  Constitutional: Negative.   HENT: Negative.   Respiratory: Negative.   Cardiovascular: Negative.        Left-sided rib pain  Gastrointestinal: Negative.   Musculoskeletal: Positive for back pain.       Chronic back pain  Skin: Negative.   Neurological: Negative.   Psychiatric/Behavioral: Negative.   All other systems reviewed and are negative.    Allergies  Shrimp and Penicillins  Home Medications   Current Outpatient Rx  Name  Route  Sig  Dispense  Refill  . oxymorphone (OPANA) 10 MG tablet   Oral   Take 10 mg by mouth every 4 (four) hours as needed for pain.         Marland Kitchen EXPIRED: albuterol (PROVENTIL HFA;VENTOLIN HFA) 108 (90 BASE) MCG/ACT inhaler   Inhalation   Inhale 2 puffs into the lungs every 4 (four) hours as needed for wheezing.   1 Inhaler   0   .  EPINEPHrine (EPIPEN) 0.3 mg/0.3 mL DEVI   Intramuscular   Inject 0.3 mLs (0.3 mg total) into the muscle as needed.   1 Device   0   . famotidine (PEPCID) 20 MG tablet   Oral   Take 1 tablet (20 mg total) by mouth 2 (two) times daily.   10 tablet   0   . Multiple Vitamins-Minerals (HAIR VITAMINS PO)   Oral   Take 2 tablets by mouth daily.         Marland Kitchen oxyCODONE-acetaminophen (PERCOCET) 5-325 MG per tablet   Oral   Take 0.5 tablets by mouth 2 (two) times daily as needed. For pain         . predniSONE (DELTASONE) 50 MG tablet   Oral   Take 1 tablet (50 mg total) by mouth daily.   5 tablet   0     BP 130/71  Pulse 75  Temp(Src) 98.3 F (36.8 C) (Oral)  Resp 20  Wt 224 lb (101.606 kg)  BMI 33.56 kg/m2  SpO2 100%  Physical Exam  Nursing note and vitals reviewed. Constitutional: She appears well-developed and well-nourished.  HENT:  Head: Normocephalic and atraumatic.  Eyes: Conjunctivae are normal. Pupils are equal, round, and reactive to light.  Neck: Neck supple. No tracheal deviation present. No thyromegaly present.  Cardiovascular: Normal rate and regular rhythm.   No murmur heard. Pulmonary/Chest: Effort normal and breath sounds normal.  Tender midaxillary line left side, reproducing pain exactly  Abdominal: Soft. Bowel sounds are normal. She exhibits no distension. There is no tenderness.  Obese  Musculoskeletal: Normal range of motion. She exhibits no edema and no tenderness.  Neurological: She is alert. Coordination normal.  Skin: Skin is warm and dry. No rash noted.  Psychiatric: She has a normal mood and affect. Thought content normal.    ED Course  Procedures (including critical care time)  Labs Reviewed - No data to display No results found.   No diagnosis found.  8:10 PM pain improved after treatment with ibuprofen. Patient resting comfortably X-rays viewed by me Results for orders placed during the hospital encounter of 11/15/11  CBC       Result Value Range   WBC 7.5  4.0 - 10.5 K/uL   RBC 3.84 (*) 3.87 - 5.11 MIL/uL   Hemoglobin 12.0  12.0 - 15.0 g/dL   HCT 81.1 (*) 91.4 - 78.2 %   MCV 87.8  78.0 - 100.0 fL   MCH 31.3  26.0 - 34.0 pg   MCHC 35.6  30.0 - 36.0 g/dL   RDW 95.6  21.3 - 08.6 %   Platelets 335  150 - 400 K/uL  BASIC METABOLIC PANEL      Result Value Range   Sodium 139  135 - 145 mEq/L   Potassium 4.2  3.5 - 5.1 mEq/L   Chloride 100  96 - 112 mEq/L   CO2 30  19 - 32 mEq/L   Glucose, Bld 110 (*) 70 - 99 mg/dL   BUN 14  6 - 23 mg/dL   Creatinine, Ser 5.78  0.50 - 1.10 mg/dL   Calcium 46.9  8.4 - 62.9 mg/dL   GFR calc non Af Amer 87 (*) >90 mL/min   GFR calc Af Amer >90  >90 mL/min  TROPONIN I      Result Value Range   Troponin I <0.30  <0.30 ng/mL  TROPONIN I      Result Value Range   Troponin I <0.30  <0.30 ng/mL   Dg Ribs Unilateral W/chest Left  10/11/2012  *RADIOLOGY REPORT*  Clinical Data: Left rib pain.  LEFT RIBS AND CHEST - 3+ VIEW  Comparison: 11/15/2011  Findings: No pneumothorax or effusion.  Lungs clear.  Heart size normal.  Detailed views of left ribs revealed no displaced fracture or other focal lesion.  IMPRESSION: Negative   Original Report Authenticated By: D. Andria Rhein, MD     MDM    Plan Rx Ibuprofen, f/u pmd if continuing to have significant pain next week  Dx: chest wall pain     Doug Sou, MD 10/11/12 2018

## 2014-06-13 ENCOUNTER — Emergency Department (HOSPITAL_BASED_OUTPATIENT_CLINIC_OR_DEPARTMENT_OTHER)
Admission: EM | Admit: 2014-06-13 | Discharge: 2014-06-13 | Disposition: A | Discharge status: Home / Self Care | Payer: BC Managed Care – PPO | Attending: Emergency Medicine | Admitting: Emergency Medicine

## 2014-06-13 ENCOUNTER — Encounter (HOSPITAL_BASED_OUTPATIENT_CLINIC_OR_DEPARTMENT_OTHER): Payer: Self-pay

## 2014-06-13 DIAGNOSIS — R112 Nausea with vomiting, unspecified: Secondary | ICD-10-CM | POA: Diagnosis not present

## 2014-06-13 DIAGNOSIS — J0110 Acute frontal sinusitis, unspecified: Secondary | ICD-10-CM | POA: Diagnosis not present

## 2014-06-13 DIAGNOSIS — R42 Dizziness and giddiness: Secondary | ICD-10-CM | POA: Insufficient documentation

## 2014-06-13 DIAGNOSIS — R51 Headache: Secondary | ICD-10-CM | POA: Diagnosis present

## 2014-06-13 DIAGNOSIS — Z880 Allergy status to penicillin: Secondary | ICD-10-CM | POA: Diagnosis not present

## 2014-06-13 DIAGNOSIS — Z87828 Personal history of other (healed) physical injury and trauma: Secondary | ICD-10-CM | POA: Insufficient documentation

## 2014-06-13 MED ORDER — SULFAMETHOXAZOLE-TRIMETHOPRIM 800-160 MG PO TABS: 1.0000 | ORAL_TABLET | Freq: Two times a day (BID) | ORAL | Status: DC

## 2014-06-13 MED ORDER — MECLIZINE HCL 50 MG PO TABS: 50.0000 mg | ORAL_TABLET | Freq: Three times a day (TID) | ORAL | Status: DC | PRN

## 2014-06-13 MED ORDER — OXYCODONE-ACETAMINOPHEN 5-325 MG PO TABS: 1.0000 | ORAL_TABLET | ORAL | Status: DC | PRN

## 2014-06-13 MED ORDER — ONDANSETRON 8 MG PO TBDP: 8.0000 mg | ORAL_TABLET | Freq: Three times a day (TID) | ORAL | Status: DC | PRN

## 2014-06-13 NOTE — ED Notes (Signed)
C/o facial pain "pressure", post nasal drip x 1 week

## 2014-06-13 NOTE — Discharge Instructions (Signed)
Sinusitis °Sinusitis is redness, soreness, and puffiness (inflammation) of the air pockets in the bones of your face (sinuses). The redness, soreness, and puffiness can cause air and mucus to get trapped in your sinuses. This can allow germs to grow and cause an infection.  °HOME CARE  °· Drink enough fluids to keep your pee (urine) clear or pale yellow. °· Use a humidifier in your home. °· Run a hot shower to create steam in the bathroom. Sit in the bathroom with the door closed. Breathe in the steam 3-4 times a day. °· Put a warm, moist washcloth on your face 3-4 times a day, or as told by your doctor. °· Use salt water sprays (saline sprays) to wet the thick fluid in your nose. This can help the sinuses drain. °· Only take medicine as told by your doctor. °GET HELP RIGHT AWAY IF:  °· Your pain gets worse. °· You have very bad headaches. °· You are sick to your stomach (nauseous). °· You throw up (vomit). °· You are very sleepy (drowsy) all the time. °· Your face is puffy (swollen). °· Your vision changes. °· You have a stiff neck. °· You have trouble breathing. °MAKE SURE YOU:  °· Understand these instructions. °· Will watch your condition. °· Will get help right away if you are not doing well or get worse. °Document Released: 12/21/2007 Document Revised: 03/28/2012 Document Reviewed: 02/07/2012 °[employer]® Patient Information ©2015 [employer]. This information is not intended to replace advice given to you by your health care provider. Make sure you discuss any questions you have with your health care provider. ° °

## 2014-06-13 NOTE — ED Provider Notes (Signed)
CSN: [id number]     Arrival date & time 06/13/14  1320 History   First MD Initiated Contact with Patient 06/13/14 1617     Chief Complaint  Patient presents with  . Facial Pain  . Emesis     (Consider location/radiation/quality/duration/timing/severity/associated sxs/prior Treatment) HPI   48 y.o. Female complaining of left sided facial pain and pressure with sinus drainage.  Pain began one week ago, congestion preceded pain. Pressure is worse with laying down. Patient feels dizzy with head movement. Patient with ear pressure but can't tellwhich ear.  No fever, sore throat,cough,chest pain.  Nausea with vomiting x of food substances x 5 today.  Vomited Monday but not again until today.    Past Medical History  Diagnosis Date  . Back injury    Past Surgical History  Procedure Laterality Date  . Abdominal hysterectomy    . Btl     No family history on file. History  Substance Use Topics  . Smoking status: Never Smoker   . Smokeless tobacco: Not on file  . Alcohol Use: No   OB History    No data available     Review of Systems  All other systems reviewed and are negative.     Allergies  Shrimp and Penicillins  Home Medications   Prior to Admission medications   Not on File   BP 131/74 mmHg  Pulse 80  Temp(Src) 98.3 F (36.8 C) (Oral)  Resp 20  Ht 5\' 8"  (1.727 m)  Wt 203 lb (92.08 kg)  BMI 30.87 kg/m2  SpO2 100% Physical Exam  Constitutional: She is oriented to person, place, and time. She appears well-developed and well-nourished.  HENT:  Head: Normocephalic and atraumatic.    Right Ear: Tympanic membrane and external ear normal.  Left Ear: Tympanic membrane and external ear normal.  Nose: Nose normal. Right sinus exhibits no maxillary sinus tenderness and no frontal sinus tenderness. Left sinus exhibits no maxillary sinus tenderness and no frontal sinus tenderness.  Mouth/Throat: Oropharynx is clear and moist.  Eyes: Conjunctivae and EOM are normal.  Pupils are equal, round, and reactive to light. Right eye exhibits no nystagmus. Left eye exhibits no nystagmus.  Neck: Normal range of motion. Neck supple.  Cardiovascular: Normal rate, regular rhythm, normal heart sounds and intact distal pulses.   Pulmonary/Chest: Effort normal and breath sounds normal. No respiratory distress. She exhibits no tenderness.  Abdominal: Soft. Bowel sounds are normal. She exhibits no distension and no mass. There is no tenderness.  Musculoskeletal: Normal range of motion. She exhibits no edema or tenderness.  Neurological: She is alert and oriented to person, place, and time. She has normal strength and normal reflexes. She displays normal reflexes. No cranial nerve deficit or sensory deficit. She exhibits normal muscle tone. She displays a negative Romberg sign. Coordination normal. GCS eye subscore is 4. GCS verbal subscore is 5. GCS motor subscore is 6.  Reflex Scores:      Tricep reflexes are 2+ on the right side and 2+ on the left side.      Bicep reflexes are 2+ on the right side and 2+ on the left side.      Brachioradialis reflexes are 2+ on the right side and 2+ on the left side.      Patellar reflexes are 2+ on the right side and 2+ on the left side.      Achilles reflexes are 2+ on the right side and 2+ on the left side. Patient with  normal gait without ataxia, shuffling, spasm, or antalgia. Speech is normal without dysarthria, dysphasia, or aphasia. Muscle strength is 5/5 in bilateral shoulders, elbow flexor and extensors, wrist flexor and extensors, and intrinsic hand muscles. 5/5 bilateral lower extremity hip flexors, extensors, knee flexors and extensors, and ankle dorsi and plantar flexors.    Skin: Skin is warm and dry. No rash noted.  Psychiatric: She has a normal mood and affect. Her behavior is normal. Judgment and thought content normal.  Nursing note and vitals reviewed.   ED Course  Procedures (including critical care time) Labs  Review Labs Reviewed - No data to display  Imaging Review No results found.   EKG Interpretation None      MDM   Final diagnoses:  Acute frontal sinusitis, recurrence not specified  Vertigo    Patient with symptoms c.w. Acute sinusitis with peripheral vertigo- patient with normal neuro exam.  Plan bactrim by mouth for abs, antivert, antiemetics and pain medicine.    Hilario Quarryanielle S Zachry Hopfensperger, MD 06/13/14 [phone]

## 2014-08-06 ENCOUNTER — Emergency Department (HOSPITAL_COMMUNITY)
Admission: EM | Admit: 2014-08-06 | Discharge: 2014-08-06 | Disposition: A | Discharge status: Home / Self Care | Payer: Worker's Compensation | Attending: Emergency Medicine | Admitting: Emergency Medicine

## 2014-08-06 ENCOUNTER — Encounter (HOSPITAL_COMMUNITY): Payer: Self-pay

## 2014-08-06 ENCOUNTER — Emergency Department (HOSPITAL_COMMUNITY): Payer: Worker's Compensation

## 2014-08-06 DIAGNOSIS — Z79899 Other long term (current) drug therapy: Secondary | ICD-10-CM | POA: Diagnosis not present

## 2014-08-06 DIAGNOSIS — J040 Acute laryngitis: Secondary | ICD-10-CM | POA: Diagnosis not present

## 2014-08-06 DIAGNOSIS — J029 Acute pharyngitis, unspecified: Secondary | ICD-10-CM | POA: Diagnosis present

## 2014-08-06 DIAGNOSIS — Z87828 Personal history of other (healed) physical injury and trauma: Secondary | ICD-10-CM | POA: Insufficient documentation

## 2014-08-06 DIAGNOSIS — M542 Cervicalgia: Secondary | ICD-10-CM | POA: Diagnosis not present

## 2014-08-06 DIAGNOSIS — Z880 Allergy status to penicillin: Secondary | ICD-10-CM | POA: Diagnosis not present

## 2014-08-06 LAB — RAPID STREP SCREEN (MED CTR MEBANE ONLY): Streptococcus, Group A Screen (Direct): NEGATIVE

## 2014-08-06 MED ORDER — GI COCKTAIL ~~LOC~~
30.0000 mL | Freq: Once | ORAL | Status: AC
  Administered 2014-08-06: 30 mL via ORAL
  Filled 2014-08-06: qty 30

## 2014-08-06 MED ORDER — IBUPROFEN 800 MG PO TABS
800.0000 mg | ORAL_TABLET | Freq: Once | ORAL | Status: AC
  Administered 2014-08-06: 800 mg via ORAL
  Filled 2014-08-06: qty 1

## 2014-08-06 MED ORDER — IBUPROFEN 800 MG PO TABS: 800.0000 mg | ORAL_TABLET | Freq: Three times a day (TID) | ORAL | Status: DC

## 2014-08-06 NOTE — ED Notes (Signed)
Bed: ZO10WA14 Expected date:  Expected time:  Means of arrival:  Comments: EMS-allergic to insulation

## 2014-08-06 NOTE — ED Notes (Signed)
She states she had some insulation "fall onto my face" three days ago; and she has had a very irritated throat with dry, hacky cough ever since.  She is in no distress.

## 2014-08-06 NOTE — ED Provider Notes (Signed)
CSN: [id number]     Arrival date & time 08/06/14  1544 History   First MD Initiated Contact with Patient 08/06/14 1549     Chief Complaint  Patient presents with  . Sore Throat     (Consider location/radiation/quality/duration/timing/severity/associated sxs/prior Treatment) The history is provided by the patient.  Sherry Russell is a 49 y.o. female here with sore throat, cough. About 3 days ago she was in the office and was close to the ceiling and some insulating material fell onto her face. She has some irritation on her eyes at that time and then also notes that she swallowed some. She has some nonproductive cough afterwards. Also felt that her throat is very dry. She also has lost her voice but denies any trouble swallowing or fevers.  Denies chest pain or shortness of breath.    Past Medical History  Diagnosis Date  . Back injury    Past Surgical History  Procedure Laterality Date  . Abdominal hysterectomy    . Btl     No family history on file. History  Substance Use Topics  . Smoking status: Never Smoker   . Smokeless tobacco: Not on file  . Alcohol Use: No   OB History    No data available     Review of Systems  HENT: Positive for sore throat.   All other systems reviewed and are negative.     Allergies  Shrimp and Penicillins  Home Medications   Prior to Admission medications   Medication Sig Start Date End Date Taking? Authorizing Provider  Naphazoline-Pheniramine (OPCON-A OP) Apply 2 drops to eye daily as needed (dry eyes).   Yes Historical Provider, MD  phentermine (ADIPEX-P) 37.5 MG tablet Take 37.5 mg by mouth daily before breakfast.   Yes Historical Provider, MD  meclizine (ANTIVERT) 50 MG tablet Take 1 tablet (50 mg total) by mouth 3 (three) times daily as needed. Patient not taking: Reported on 08/06/2014 06/13/14   Hilario Quarry, MD  meclizine (ANTIVERT) 50 MG tablet Take 1 tablet (50 mg total) by mouth 3 (three) times daily as needed. Patient  not taking: Reported on 08/06/2014 06/13/14   Hilario Quarry, MD  ondansetron (ZOFRAN ODT) 8 MG disintegrating tablet Take 1 tablet (8 mg total) by mouth every 8 (eight) hours as needed for nausea or vomiting. Patient not taking: Reported on 08/06/2014 06/13/14   Hilario Quarry, MD  oxyCODONE-acetaminophen (PERCOCET/ROXICET) 5-325 MG per tablet Take 1 tablet by mouth every 4 (four) hours as needed for moderate pain or severe pain. Patient not taking: Reported on 08/06/2014 06/13/14   Hilario Quarry, MD  sulfamethoxazole-trimethoprim (SEPTRA DS) 800-160 MG per tablet Take 1 tablet by mouth every 12 (twelve) hours. Patient not taking: Reported on 08/06/2014 06/13/14   Hilario Quarry, MD   BP 112/62 mmHg  Pulse 87  Temp(Src) 97.3 F (36.3 C) (Oral)  Resp 18  SpO2 100% Physical Exam  Constitutional: She is oriented to person, place, and time. She appears well-developed and well-nourished.  HENT:  Head: Normocephalic.  Right Ear: External ear normal.  Left Ear: External ear normal.  OP clear. Uvula midline. No obvious PTA.   Eyes: Conjunctivae and EOM are normal. Pupils are equal, round, and reactive to light.  Neck:  Mild cervical LAD. No stridor   Cardiovascular: Normal rate, regular rhythm and normal heart sounds.   Pulmonary/Chest: Effort normal and breath sounds normal. No respiratory distress. She has no wheezes. She has no rales.  Abdominal: Soft. Bowel sounds are normal. She exhibits no distension. There is no tenderness. There is no rebound and no guarding.  Musculoskeletal: Normal range of motion. She exhibits no edema or tenderness.  Neurological: She is alert and oriented to person, place, and time. No cranial nerve deficit. Coordination normal.  Skin: Skin is warm and dry.  Psychiatric: She has a normal mood and affect. Her behavior is normal. Judgment and thought content normal.  Nursing note and vitals reviewed.   ED Course  Procedures (including critical care time) Labs  Review Labs Reviewed  RAPID STREP SCREEN  CULTURE, GROUP A STREP    Imaging Review Dg Neck Soft Tissue  08/06/2014   CLINICAL DATA:  She states she had some insulation "fall onto my face" three days ago; and she has had a very irritated throat with dry, hacky cough ever since. She is in no distress. Pt stated no other chest hx  EXAM: NECK SOFT TISSUES - 1+ VIEW  COMPARISON:  08/31/2008  FINDINGS: There weighted widely patent. Soft tissues are unremarkable. Calcification of the thyroid cartilages have mildly advanced when compared to the prior study. No radiopaque foreign body.  Mild loss of disc height with anterior endplate spurring at C4-C5 through C6-C7.  IMPRESSION: Unremarkable soft tissues. Widely patent airway. No radiopaque foreign body.   Electronically Signed   By: Amie Portlandavid  Ormond M.D.   On: 08/06/2014 18:39     EKG Interpretation None      MDM   Final diagnoses:  Neck pain   Sherry Russell is a 49 y.o. female here with sore throat, cough. Likely viral vs reaction to insulation material. No evidence of PTA. I doubt RPA. Will get rapid strep and motrin for symptomatic relief.   6:44 PM Xray showed no obvious RPA. Strep neg. I think likely has laryngitis. Will dc home with motrin.     Richardean Canalavid H Cherree Conerly, MD 08/06/14 [phone]

## 2014-08-06 NOTE — Discharge Instructions (Signed)
Take motrin 800 mg every 6 hrs for pain.   Stay hydrated.   Rest for 2 days.   Follow up with your doctor.   Return to ER if you have severe pain, trouble breathing, fever.

## 2014-08-08 LAB — CULTURE, GROUP A STREP

## 2014-12-05 ENCOUNTER — Ambulatory Visit: Payer: Self-pay | Admitting: Family

## 2015-04-16 ENCOUNTER — Encounter (HOSPITAL_COMMUNITY): Payer: Self-pay

## 2015-04-16 ENCOUNTER — Emergency Department (HOSPITAL_COMMUNITY): Payer: Managed Care, Other (non HMO)

## 2015-04-16 ENCOUNTER — Emergency Department (HOSPITAL_COMMUNITY)
Admission: EM | Admit: 2015-04-16 | Discharge: 2015-04-16 | Disposition: A | Discharge status: Home / Self Care | Payer: Managed Care, Other (non HMO) | Attending: Emergency Medicine | Admitting: Emergency Medicine

## 2015-04-16 DIAGNOSIS — R0789 Other chest pain: Secondary | ICD-10-CM

## 2015-04-16 DIAGNOSIS — R5383 Other fatigue: Secondary | ICD-10-CM | POA: Insufficient documentation

## 2015-04-16 DIAGNOSIS — R079 Chest pain, unspecified: Secondary | ICD-10-CM | POA: Insufficient documentation

## 2015-04-16 DIAGNOSIS — Z7982 Long term (current) use of aspirin: Secondary | ICD-10-CM | POA: Diagnosis not present

## 2015-04-16 DIAGNOSIS — E119 Type 2 diabetes mellitus without complications: Secondary | ICD-10-CM | POA: Diagnosis not present

## 2015-04-16 DIAGNOSIS — Z880 Allergy status to penicillin: Secondary | ICD-10-CM | POA: Diagnosis not present

## 2015-04-16 DIAGNOSIS — R110 Nausea: Secondary | ICD-10-CM | POA: Diagnosis not present

## 2015-04-16 LAB — CBC
HCT: 33.7 % — ABNORMAL LOW (ref 36.0–46.0)
HEMOGLOBIN: 11.4 g/dL — AB (ref 12.0–15.0)
MCH: 30.5 pg (ref 26.0–34.0)
MCHC: 33.8 g/dL (ref 30.0–36.0)
MCV: 90.1 fL (ref 78.0–100.0)
Platelets: 282 10*3/uL (ref 150–400)
RBC: 3.74 MIL/uL — AB (ref 3.87–5.11)
RDW: 12.7 % (ref 11.5–15.5)
WBC: 6.4 10*3/uL (ref 4.0–10.5)

## 2015-04-16 LAB — BASIC METABOLIC PANEL
Anion gap: 6 (ref 5–15)
BUN: 10 mg/dL (ref 6–20)
CO2: 26 mmol/L (ref 22–32)
Calcium: 8.9 mg/dL (ref 8.9–10.3)
Chloride: 106 mmol/L (ref 101–111)
Creatinine, Ser: 0.73 mg/dL (ref 0.44–1.00)
GFR calc non Af Amer: >60 mL/min (ref >60)
Glucose, Bld: 91 mg/dL (ref 65–99)
Potassium: 3.6 mmol/L (ref 3.5–5.1)
SODIUM: 138 mmol/L (ref 135–145)

## 2015-04-16 LAB — I-STAT CHEM 8, ED
BUN: 11 mg/dL (ref 6–20)
CHLORIDE: 107 mmol/L (ref 101–111)
CREATININE: 0.7 mg/dL (ref 0.44–1.00)
Calcium, Ion: 1.19 mmol/L (ref 1.12–1.23)
GLUCOSE: 91 mg/dL (ref 65–99)
HCT: 37 % (ref 36.0–46.0)
Hemoglobin: 12.6 g/dL (ref 12.0–15.0)
POTASSIUM: 3.7 mmol/L (ref 3.5–5.1)
Sodium: 143 mmol/L (ref 135–145)
TCO2: 23 mmol/L (ref 0–100)

## 2015-04-16 LAB — I-STAT TROPONIN, ED: TROPONIN I, POC: 0 ng/mL (ref 0.00–0.08)

## 2015-04-16 MED ORDER — ACETAMINOPHEN 325 MG PO TABS
650.0000 mg | ORAL_TABLET | Freq: Once | ORAL | Status: AC
  Administered 2015-04-16: 650 mg via ORAL
  Filled 2015-04-16: qty 2

## 2015-04-16 NOTE — ED Provider Notes (Signed)
CSN: [id number]     Arrival date & time 04/16/15  1942 History   First MD Initiated Contact with Patient 04/16/15 1950     Chief Complaint  Patient presents with  . Chest Pain     (Consider location/radiation/quality/duration/timing/severity/associated sxs/prior Treatment) HPI Comments: Patient states that today around 1:00 she noticed that she had some tightness across her chest that radiated to her right arm.  It is now radiated to her left arm as well.  She thinks she may have been slightly nauseated and sweaty on onset, but this has resolved.  She went to urgent care course she just wasn't feeling well.  They immediately sent her via EMS to the ER for evaluation of chest pain.  He was given 1 325 mg aspirin prior to arrival. She reports that she is prediabetic and has a problem with her sugars dropping rapidly when they get 200.  She gets a headache.  She tests her blood sugar several times daily.  She states that she normally wakes up with a blood sugar in the 90s with a slight headache that resolves as soon as she eats.  Her blood sugar has gotten as high as 160 midday and then normalizes shortly thereafter.  She does not have a primary care physician, but is followed by an OB/GYN.  She is awaiting her first PCP appointment  Patient is a 49 y.o. female presenting with chest pain. The history is provided by the patient.  Chest Pain Pain location:  L chest and R chest Pain quality: pressure   Pain radiates to:  L arm and R arm Pain radiates to the back: no   Pain severity:  Moderate Onset quality:  Gradual Duration:  8 hours Timing:  Constant Progression:  Unchanged Chronicity:  New Relieved by:  Nothing Worsened by:  Nothing tried Ineffective treatments:  None tried Associated symptoms: fatigue and nausea   Associated symptoms: no diaphoresis, no palpitations, no shortness of breath and not vomiting     Past Medical History  Diagnosis Date  . Back injury   . Diabetes mellitus  without complication    Past Surgical History  Procedure Laterality Date  . Abdominal hysterectomy    . Btl     No family history on file. Social History  Substance Use Topics  . Smoking status: Never Smoker   . Smokeless tobacco: None  . Alcohol Use: No   OB History    No data available     Review of Systems  Constitutional: Positive for fatigue. Negative for diaphoresis.  Respiratory: Negative for shortness of breath.   Cardiovascular: Positive for chest pain. Negative for palpitations.  Gastrointestinal: Positive for nausea. Negative for vomiting.      Allergies  Shrimp and Penicillins  Home Medications   Prior to Admission medications   Medication Sig Start Date End Date Taking? Authorizing Shir Bergman  aspirin EC 81 MG tablet Take 81 mg by mouth every 4 (four) hours as needed for mild pain or moderate pain.   Yes Historical Delva Derden, MD  ibuprofen (ADVIL,MOTRIN) 800 MG tablet Take 1 tablet (800 mg total) by mouth 3 (three) times daily. Patient not taking: Reported on 04/16/2015 08/06/14   Richardean Canal, MD  meclizine (ANTIVERT) 50 MG tablet Take 1 tablet (50 mg total) by mouth 3 (three) times daily as needed. Patient not taking: Reported on 08/06/2014 06/13/14   Margarita Grizzle, MD  meclizine (ANTIVERT) 50 MG tablet Take 1 tablet (50 mg total) by mouth 3 (  three) times daily as needed. Patient not taking: Reported on 08/06/2014 06/13/14   Margarita Grizzle, MD  ondansetron (ZOFRAN ODT) 8 MG disintegrating tablet Take 1 tablet (8 mg total) by mouth every 8 (eight) hours as needed for nausea or vomiting. Patient not taking: Reported on 08/06/2014 06/13/14   Margarita Grizzle, MD  oxyCODONE-acetaminophen (PERCOCET/ROXICET) 5-325 MG per tablet Take 1 tablet by mouth every 4 (four) hours as needed for moderate pain or severe pain. Patient not taking: Reported on 08/06/2014 06/13/14   Margarita Grizzle, MD  sulfamethoxazole-trimethoprim (SEPTRA DS) 800-160 MG per tablet Take 1 tablet by mouth every 12  (twelve) hours. Patient not taking: Reported on 08/06/2014 06/13/14   Margarita Grizzle, MD   BP 124/69 mmHg  Pulse 72  Temp(Src) 98 F (36.7 C)  Resp 15  Ht  (1.727 m)  Wt 218 lb (98.884 kg)  BMI 33.15 kg/m2  SpO2 100% Physical Exam  Constitutional: She is oriented to person, place, and time. She appears well-developed and well-nourished. No distress.  HENT:  Head: Normocephalic.  Mouth/Throat: Oropharynx is clear and moist.  Eyes: Pupils are equal, round, and reactive to light.  Neck: Normal range of motion.  Cardiovascular: Normal rate and regular rhythm.   Pulmonary/Chest: Effort normal and breath sounds normal.  Abdominal: Soft. She exhibits no distension. There is no tenderness.  Musculoskeletal: She exhibits no edema or tenderness.  Neurological: She is alert and oriented to person, place, and time.  Skin: Skin is warm.  Nursing note and vitals reviewed.   ED Course  Procedures (including critical care time) Labs Review Labs Reviewed  CBC - Abnormal; Notable for the following:    RBC 3.74 (*)    Hemoglobin 11.4 (*)    HCT 33.7 (*)    All other components within normal limits  BASIC METABOLIC PANEL  I-STAT CHEM 8, ED  I-STAT TROPOININ, ED    Imaging Review Dg Chest 2 View  04/16/2015   CLINICAL DATA:  49 year old female with pain extending from the right arm across the left side of the chest. Pain described as pressure like sensation rated 6 out of 10. Diaphoresis.  EXAM: CHEST  2 VIEW  COMPARISON:  Chest x-ray 10/11/2012.  FINDINGS: Lung volumes are normal. No consolidative airspace disease. No pleural effusions. No pneumothorax. No pulmonary nodule or mass noted. Pulmonary vasculature and the cardiomediastinal silhouette are within normal limits.  IMPRESSION: No radiographic evidence of acute cardiopulmonary disease.   Electronically Signed   By: Trudie Reed M.D.   On: 04/16/2015 20:19   I have personally reviewed and evaluated these images and lab results as  part of my medical decision-making.   EKG Interpretation   Date/Time:  Thursday April 16 2015 19:50:39 EDT Ventricular Rate:  67 PR Interval:  174 QRS Duration: 84 QT Interval:  424 QTC Calculation: 448 R Axis:   74 Text Interpretation:  Sinus rhythm No significant change since last  tracing Confirmed by YAO  MD, DAVID ([id number]) on 04/16/2015 8:11:11 PM     patient was evaluated for chest pain today with negative cardiac markers, normal electrolytes, although patient states that she feels like her blood sugar is low, it is 91 on our evaluation.  She states when he gets below 100.  She gets a headache and feels shaky.  She was given food and Tylenol for her symptoms.  She states she has an appointment with primary care physician in the next 2 weeks to become established as her only physician  to this point has been OB/GYN  MDM   Final diagnoses:  Chest pain of unknown etiology         Earley Favor, NP 04/16/15 2307  Richardean Canal, MD 04/17/15 [phone]

## 2015-04-16 NOTE — Discharge Instructions (Signed)
Chest Pain (Nonspecific) It is often hard to give a diagnosis for the cause of chest pain. There is always a chance that your pain could be related to something serious, such as a heart attack or a blood clot in the lungs. You need to follow up with your doctor. HOME CARE  If antibiotic medicine was given, take it as directed by your doctor. Finish the medicine even if you start to feel better.  For the next few days, avoid activities that bring on chest pain. Continue physical activities as told by your doctor.  Do not use any tobacco products. This includes cigarettes, chewing tobacco, and e-cigarettes.  Avoid drinking alcohol.  Only take medicine as told by your doctor.  Follow your doctor's suggestions for more testing if your chest pain does not go away.  Keep all doctor visits you made. GET HELP IF:  Your chest pain does not go away, even after treatment.  You have a rash with blisters on your chest.  You have a fever. GET HELP RIGHT AWAY IF:   You have more pain or pain that spreads to your arm, neck, jaw, back, or belly (abdomen).  You have shortness of breath.  You cough more than usual or cough up blood.  You have very bad back or belly pain.  You feel sick to your stomach (nauseous) or throw up (vomit).  You have very bad weakness.  You pass out (faint).  You have chills. This is an emergency. Do not wait to see if the problems will go away. Call your local emergency services (911 in [place] Do not drive yourself to the hospital. MAKE SURE YOU:   Understand these instructions.  Will watch your condition.  Will get help right away if you are not doing well or get worse. Document Released: 12/21/2007 Document Revised: 07/09/2013 Document Reviewed: 12/21/2007 [facility] Patient Information 2015 [place], [place]. This information is not intended to replace advice given to you by your health care provider. Make sure you discuss any questions you have with your  health care provider. Today you were evaluated for your chest pain, your cardiac markers are normal, your EKG is normal.  Your chest x-ray is normal.  Your electrolytes are normal.  Your blood sugar was 91.  Please make an appointment with your primary care physician/internist for further evaluation

## 2015-04-16 NOTE — ED Notes (Addendum)
Pt come from [facility] via [facility], started around 1pm, pain started in R arm across chest to L arm, feels like pressure. Pain 6/10, denies n/v, was diaphoretic at onset. PTA received 324 ASA

## 2016-03-09 ENCOUNTER — Emergency Department (HOSPITAL_BASED_OUTPATIENT_CLINIC_OR_DEPARTMENT_OTHER): Payer: BLUE CROSS/BLUE SHIELD

## 2016-03-09 ENCOUNTER — Emergency Department (HOSPITAL_BASED_OUTPATIENT_CLINIC_OR_DEPARTMENT_OTHER)
Admission: EM | Admit: 2016-03-09 | Discharge: 2016-03-09 | Disposition: A | Discharge status: Home / Self Care | Payer: BLUE CROSS/BLUE SHIELD | Attending: Emergency Medicine | Admitting: Emergency Medicine

## 2016-03-09 ENCOUNTER — Encounter (HOSPITAL_BASED_OUTPATIENT_CLINIC_OR_DEPARTMENT_OTHER): Payer: Self-pay

## 2016-03-09 DIAGNOSIS — R079 Chest pain, unspecified: Secondary | ICD-10-CM | POA: Diagnosis not present

## 2016-03-09 DIAGNOSIS — Z7982 Long term (current) use of aspirin: Secondary | ICD-10-CM | POA: Diagnosis not present

## 2016-03-09 DIAGNOSIS — M5412 Radiculopathy, cervical region: Secondary | ICD-10-CM

## 2016-03-09 HISTORY — DX: Hypoglycemia, unspecified: E16.2

## 2016-03-09 HISTORY — DX: Cardiac arrhythmia, unspecified: I49.9

## 2016-03-09 LAB — BASIC METABOLIC PANEL
Anion gap: 10 (ref 5–15)
BUN: 11 mg/dL (ref 6–20)
CHLORIDE: 105 mmol/L (ref 101–111)
CO2: 23 mmol/L (ref 22–32)
CREATININE: 0.69 mg/dL (ref 0.44–1.00)
Calcium: 8.9 mg/dL (ref 8.9–10.3)
GFR calc Af Amer: >60 mL/min (ref >60)
GFR calc non Af Amer: >60 mL/min (ref >60)
Glucose, Bld: 119 mg/dL — ABNORMAL HIGH (ref 65–99)
Potassium: 3.4 mmol/L — ABNORMAL LOW (ref 3.5–5.1)
Sodium: 138 mmol/L (ref 135–145)

## 2016-03-09 LAB — CBC WITH DIFFERENTIAL/PLATELET
BASOS PCT: 0 %
Basophils Absolute: 0 10*3/uL (ref 0.0–0.1)
EOS ABS: 0.1 10*3/uL (ref 0.0–0.7)
EOS PCT: 1 %
HEMATOCRIT: 33.4 % — AB (ref 36.0–46.0)
Hemoglobin: 11.5 g/dL — ABNORMAL LOW (ref 12.0–15.0)
Lymphocytes Relative: 67 %
Lymphs Abs: 4.6 10*3/uL — ABNORMAL HIGH (ref 0.7–4.0)
MCH: 30.4 pg (ref 26.0–34.0)
MCHC: 34.4 g/dL (ref 30.0–36.0)
MCV: 88.4 fL (ref 78.0–100.0)
MONOS PCT: 7 %
Monocytes Absolute: 0.5 10*3/uL (ref 0.1–1.0)
Neutro Abs: 1.7 10*3/uL (ref 1.7–7.7)
Neutrophils Relative %: 25 %
Platelets: 284 10*3/uL (ref 150–400)
RBC: 3.78 MIL/uL — ABNORMAL LOW (ref 3.87–5.11)
RDW: 12.1 % (ref 11.5–15.5)
WBC: 6.9 10*3/uL (ref 4.0–10.5)

## 2016-03-09 LAB — TROPONIN I

## 2016-03-09 MED ORDER — HYDROCODONE-ACETAMINOPHEN 5-325 MG PO TABS: 1.0000 | ORAL_TABLET | Freq: Four times a day (QID) | ORAL | 0 refills | Status: DC | PRN

## 2016-03-09 MED ORDER — MELOXICAM 15 MG PO TABS: 15.0000 mg | ORAL_TABLET | Freq: Every day | ORAL | 0 refills | Status: AC

## 2016-03-09 NOTE — ED Triage Notes (Signed)
Pt c/o mid chest pain that started earlier tonight that radiates to her left upper back and shoulder and down her left arm with mild SOB.  Pt attempted to relieve symptoms with tums and it didn't help.

## 2016-03-09 NOTE — ED Provider Notes (Addendum)
[facility] Provider Note   CSN: [id number] Arrival date & time: 03/09/16  0054     History   Chief Complaint Chief Complaint  Patient presents with  . Chest Pain    HPI Sherry Russell is a 50 y.o. female who developed a sharp pain in her left upper chest, left neck, and left shoulder radiating down her left arm in about the C7 dermatome. There have been associated paresthesias of the left second and third fingers as well. She rates her pain as a 7.5 out of 10 at the present time. Pain is worse with rotation of the neck to the right and less with rotation of the neck to the left. She was having mild shortness of breath earlier but acknowledges that she has been anxious about her symptoms. She took Zantac and Tums without relief.  HPI  Past Medical History:  Diagnosis Date  . Arrhythmia   . Back injury   . Hypoglycemia     There are no active problems to display for this patient.   Past Surgical History:  Procedure Laterality Date  . ABDOMINAL HYSTERECTOMY    . BTL      OB History    No data available       Home Medications    Prior to Admission medications   Medication Sig Start Date End Date Taking? Authorizing Provider  aspirin EC 81 MG tablet Take 81 mg by mouth every 4 (four) hours as needed for mild pain or moderate pain.    Historical Provider, MD  ibuprofen (ADVIL,MOTRIN) 800 MG tablet Take 1 tablet (800 mg total) by mouth 3 (three) times daily. Patient not taking: Reported on 04/16/2015 08/06/14   Charlynne Panderavid Hsienta Yao, MD  meclizine (ANTIVERT) 50 MG tablet Take 1 tablet (50 mg total) by mouth 3 (three) times daily as needed. Patient not taking: Reported on 08/06/2014 06/13/14   Margarita Grizzleanielle Ray, MD  meclizine (ANTIVERT) 50 MG tablet Take 1 tablet (50 mg total) by mouth 3 (three) times daily as needed. Patient not taking: Reported on 08/06/2014 06/13/14   Margarita Grizzleanielle Ray, MD  ondansetron (ZOFRAN ODT) 8 MG disintegrating tablet Take 1 tablet (8 mg total) by  mouth every 8 (eight) hours as needed for nausea or vomiting. Patient not taking: Reported on 08/06/2014 06/13/14   Margarita Grizzleanielle Ray, MD  oxyCODONE-acetaminophen (PERCOCET/ROXICET) 5-325 MG per tablet Take 1 tablet by mouth every 4 (four) hours as needed for moderate pain or severe pain. Patient not taking: Reported on 08/06/2014 06/13/14   Margarita Grizzleanielle Ray, MD  sulfamethoxazole-trimethoprim (SEPTRA DS) 800-160 MG per tablet Take 1 tablet by mouth every 12 (twelve) hours. Patient not taking: Reported on 08/06/2014 06/13/14   Margarita Grizzleanielle Ray, MD    Family History No family history on file.  Social History Social History  Substance Use Topics  . Smoking status: Never Smoker  . Smokeless tobacco: Not on file  . Alcohol use No     Allergies   Shrimp [shellfish allergy] and Penicillins   Review of Systems Review of Systems  All other systems reviewed and are negative.   Physical Exam Updated Vital Signs BP 114/71 (BP Location: Left Arm)   Pulse (!) 52   Temp 97.8 F (36.6 C) (Oral)   Resp 18   Ht 5\' 8"  (1.727 m)   Wt 225 lb (102.1 kg)   SpO2 100%   BMI 34.21 kg/m   Physical Exam General: Well-developed, well-nourished female in no acute distress; appearance consistent with age  of record HENT: normocephalic; atraumatic Eyes: pupils equal, round and reactive to light; extraocular muscles intact Neck: supple; rotation of head to the right reproduces the pain of chief complaint Heart: regular rate and rhythm Lungs: clear to auscultation bilaterally Abdomen: soft; nondistended; nontender; bowel sounds present Extremities: No deformity; full range of motion; pulses normal Neurologic: Awake, alert and oriented; motor function intact in all extremities and symmetric; no facial droop Skin: Warm and dry Psychiatric: Normal mood and affect    ED Treatments / Results   Nursing notes and vitals signs, including pulse oximetry, reviewed.  Summary of this visit's results, reviewed by  myself:  Labs:  Results for orders placed or performed during the hospital encounter of 03/09/16 (from the past 24 hour(s))  CBC with Differential     Status: Abnormal   Collection Time: 03/09/16  1:00 AM  Result Value Ref Range   WBC 6.9 4.0 - 10.5 K/uL   RBC 3.78 (L) 3.87 - 5.11 MIL/uL   Hemoglobin 11.5 (L) 12.0 - 15.0 g/dL   HCT 16.133.4 (L) 09.636.0 - 04.546.0 %   MCV 88.4 78.0 - 100.0 fL   MCH 30.4 26.0 - 34.0 pg   MCHC 34.4 30.0 - 36.0 g/dL   RDW 40.912.1 81.111.5 - 91.415.5 %   Platelets 284 150 - 400 K/uL   Neutrophils Relative % 25 %   Neutro Abs 1.7 1.7 - 7.7 K/uL   Lymphocytes Relative 67 %   Lymphs Abs 4.6 (H) 0.7 - 4.0 K/uL   Monocytes Relative 7 %   Monocytes Absolute 0.5 0.1 - 1.0 K/uL   Eosinophils Relative 1 %   Eosinophils Absolute 0.1 0.0 - 0.7 K/uL   Basophils Relative 0 %   Basophils Absolute 0.0 0.0 - 0.1 K/uL  Troponin I     Status: None   Collection Time: 03/09/16  1:00 AM  Result Value Ref Range   Troponin I <0.03 <0.03 ng/mL  Basic metabolic panel     Status: Abnormal   Collection Time: 03/09/16  1:00 AM  Result Value Ref Range   Sodium 138 135 - 145 mmol/L   Potassium 3.4 (L) 3.5 - 5.1 mmol/L   Chloride 105 101 - 111 mmol/L   CO2 23 22 - 32 mmol/L   Glucose, Bld 119 (H) 65 - 99 mg/dL   BUN 11 6 - 20 mg/dL   Creatinine, Ser 7.820.69 0.44 - 1.00 mg/dL   Calcium 8.9 8.9 - 95.610.3 mg/dL   GFR calc non Af Amer >60 >60 mL/min   GFR calc Af Amer >60 >60 mL/min   Anion gap 10 5 - 15    Imaging Studies: Dg Chest 2 View  Result Date: 03/09/2016 CLINICAL DATA:  Intermittent left-sided chest pain for 1 day. EXAM: CHEST  2 VIEW COMPARISON:  04/16/2015 FINDINGS: The cardiomediastinal contours are normal. The lungs are clear. Pulmonary vasculature is normal. No consolidation, pleural effusion, or pneumothorax. No acute osseous abnormalities are seen. Small bilateral cervical ribs. IMPRESSION: No acute pulmonary process. Electronically Signed   By: Rubye OaksMelanie  Ehinger M.D.   On: 03/09/2016  01:59    Procedures (including critical care time)   Final Clinical Impressions(s) / ED Diagnoses  The patient has a PCP with whom she can follow-up.   Final diagnoses:  Cervical radiculopathy      Paula LibraJohn Dominga Mcduffie, MD 03/09/16 0221    Paula LibraJohn Herny Scurlock, MD 03/09/16 [phone]

## 2016-03-09 NOTE — ED Notes (Signed)
Pt verbalizes understanding of d/c instructions and denies any further needs at this time. 

## 2016-03-14 DIAGNOSIS — Z1231 Encounter for screening mammogram for malignant neoplasm of breast: Secondary | ICD-10-CM | POA: Diagnosis not present

## 2016-03-14 DIAGNOSIS — Z6834 Body mass index (BMI) 34.0-34.9, adult: Secondary | ICD-10-CM | POA: Diagnosis not present

## 2016-03-14 DIAGNOSIS — Z01419 Encounter for gynecological examination (general) (routine) without abnormal findings: Secondary | ICD-10-CM | POA: Diagnosis not present

## 2016-04-13 DIAGNOSIS — K219 Gastro-esophageal reflux disease without esophagitis: Secondary | ICD-10-CM | POA: Diagnosis not present

## 2016-04-13 DIAGNOSIS — R6884 Jaw pain: Secondary | ICD-10-CM | POA: Diagnosis not present

## 2016-04-13 DIAGNOSIS — M542 Cervicalgia: Secondary | ICD-10-CM | POA: Diagnosis not present

## 2016-04-20 DIAGNOSIS — K219 Gastro-esophageal reflux disease without esophagitis: Secondary | ICD-10-CM | POA: Diagnosis not present

## 2016-04-20 DIAGNOSIS — R6884 Jaw pain: Secondary | ICD-10-CM | POA: Diagnosis not present

## 2016-04-20 DIAGNOSIS — M542 Cervicalgia: Secondary | ICD-10-CM | POA: Diagnosis not present

## 2016-04-25 DIAGNOSIS — E559 Vitamin D deficiency, unspecified: Secondary | ICD-10-CM | POA: Diagnosis not present

## 2016-04-25 DIAGNOSIS — Z0000 Encounter for general adult medical examination without abnormal findings: Secondary | ICD-10-CM | POA: Diagnosis not present

## 2016-04-25 DIAGNOSIS — Z136 Encounter for screening for cardiovascular disorders: Secondary | ICD-10-CM | POA: Diagnosis not present

## 2016-04-29 DIAGNOSIS — E559 Vitamin D deficiency, unspecified: Secondary | ICD-10-CM | POA: Diagnosis not present

## 2016-04-29 DIAGNOSIS — Z23 Encounter for immunization: Secondary | ICD-10-CM | POA: Diagnosis not present

## 2016-04-29 DIAGNOSIS — Z0000 Encounter for general adult medical examination without abnormal findings: Secondary | ICD-10-CM | POA: Diagnosis not present

## 2016-04-29 DIAGNOSIS — E119 Type 2 diabetes mellitus without complications: Secondary | ICD-10-CM | POA: Diagnosis not present

## 2016-04-29 DIAGNOSIS — Z1211 Encounter for screening for malignant neoplasm of colon: Secondary | ICD-10-CM | POA: Diagnosis not present

## 2016-05-19 DIAGNOSIS — D010 Carcinoma in situ of colon: Secondary | ICD-10-CM | POA: Diagnosis not present

## 2016-05-19 DIAGNOSIS — K635 Polyp of colon: Secondary | ICD-10-CM | POA: Diagnosis not present

## 2016-05-19 DIAGNOSIS — D122 Benign neoplasm of ascending colon: Secondary | ICD-10-CM | POA: Diagnosis not present

## 2016-05-19 DIAGNOSIS — D123 Benign neoplasm of transverse colon: Secondary | ICD-10-CM | POA: Diagnosis not present

## 2016-05-19 DIAGNOSIS — Z1211 Encounter for screening for malignant neoplasm of colon: Secondary | ICD-10-CM | POA: Diagnosis not present

## 2016-05-31 ENCOUNTER — Ambulatory Visit: Payer: Self-pay | Admitting: General Surgery

## 2016-05-31 DIAGNOSIS — D126 Benign neoplasm of colon, unspecified: Secondary | ICD-10-CM | POA: Diagnosis not present

## 2016-05-31 NOTE — H&P (Signed)
Sherry Russell 05/31/2016 3:00 PM Location: [facility] Patient #: [id number] DOB: 1966-07-07 Single / Language: [place] / Race: Black or African American Female  History of Present Illness Sherry Russell(Ophelia Sipe M. Bary Limbach MD; 05/31/2016 7:10 PM) The patient is a 50 year old female who presents with a colonic mass. She is referred by Dr Kinnie ScalesMedoff for evaluation of a high grade dysplastic right colonic polyp. She states that she was undergoing routine screening colonoscopy earlier this month. She states that she was found to have several polyps. 2 of which were removed and came back as tubular adenomas. There was a 4 cm irregular polyp with ulceration in her ascending colon. It was biopsied and came back as tubular adenoma with high-grade dysplasia. She denies any family history of colorectal cancer or other malignancies. She denies any alteration in her bowel habits recently. She denies any melena or hematochezia. She denies any abdominal pain, nausea or vomiting. She did have a remote history of very infrequent stools sometimes going weeks without a bowel movement however since taking a herbal tea she is having daily bowel movements. She has had a prior hysterectomy and C-section. She denies any chest pain, chest pressure, short of breath, orthopnea, paroxysmal nocturnal dyspnea, jaw pain, unexplained nausea. She does not smoke.  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 Penicillins Rash.  Medication History Sherry Russell(Easter Kennebrew M Hula Tasso, MD;  05/31/2016 7:12 PM) Meloxicam (15MG  Tablet, Oral as needed) Active. Hydrocodone-Acetaminophen (5-325MG  Tablet, Oral as needed) Active. Aspirin EC (81MG  Tablet DR, Oral as needed) Active. Medications Reconciled MetroNIDAZOLE (500MG  Tablet, 1 (one) Tablet Oral Take @ 2pm, 3pm and 10pm the day before surgery, Taken starting 05/31/2016) Active. Neomycin Sulfate (500MG  Tablet, 2 (two) Tablet Oral SEE NOTE, Taken starting 05/31/2016) Active. (TAKE TWO TABLETS AT 2 PM, 3 PM, AND 10 PM THE DAY PRIOR TO SURGERY)  Social History Timmothy Russell([place]ade [place]; 05/31/2016 3:00 PM) Alcohol use Occasional alcohol use. Caffeine use Tea. Tobacco use Never smoker.  Family History Timmothy Russell([place]; 05/31/2016 3:00 PM) Arthritis Father. Diabetes Mellitus Mother. Hypertension Mother. Seizure disorder Brother.     Review of Systems Timmothy Russell(Sade Bradford CMA; 05/31/2016 3:00 PM) General Not Present- Appetite Loss, Chills, Fatigue, Fever, Night Sweats, Weight Gain and Weight Loss. Skin Not Present- Change in Wart/Mole, Dryness, Hives, Jaundice, New Lesions, Non-Healing Wounds, Rash and Ulcer. HEENT Not Present- Earache, Hearing Loss, Hoarseness, Nose Bleed, Oral Ulcers, Ringing in the Ears, Seasonal Allergies, Sinus Pain, Sore Throat, Visual Disturbances, Wears glasses/contact lenses and Yellow Eyes. Respiratory Not Present- Bloody sputum, Chronic Cough, Difficulty Breathing, Snoring and Wheezing. Breast Not Present- Breast Mass, Breast Pain, Nipple Discharge and Skin Changes. Cardiovascular Not Present- Chest Pain, Difficulty Breathing Lying Down, Leg Cramps, Palpitations, Rapid Heart Rate, Shortness of Breath and Swelling of Extremities. Gastrointestinal Present- Constipation. Not Present- Abdominal Pain, Bloating, Bloody Stool, Change in Bowel Habits, Chronic diarrhea, Difficulty Swallowing, Excessive gas, Gets full quickly at meals, Hemorrhoids, Indigestion, Nausea, Rectal Pain and  Vomiting. Musculoskeletal Present- Back Pain. Not Present- Joint Pain, Joint Stiffness, Muscle Pain, Muscle Weakness and Swelling of Extremities. Neurological Not Present- Decreased  Memory, Fainting, Headaches, Numbness, Seizures, Tingling, Tremor, Trouble walking and Weakness. Psychiatric Not Present- Anxiety, Bipolar, Change in Sleep Pattern, Depression, Fearful and Frequent crying. Endocrine Present- Hair Changes and Hot flashes. Not Present- Cold Intolerance, Excessive Hunger, Heat Intolerance and New Diabetes. Hematology Not Present- Blood Thinners, Easy Bruising, Excessive bleeding, Gland problems, HIV and Persistent Infections.  Vitals Barron Alvine(Sade Bradford CMA; 05/31/2016 3:03 PM) 05/31/2016 3:03 PM Weight: 220 lb Height: 68.5in Body Surface Area: 2.14 m Body Mass Index: 32.96 kg/m  Temp.: 98.57F  Pulse: 74 (Regular)  BP: 118/72 (Sitting, Left Arm, Standard)      Physical Exam Sherry Russell(Khaden Gater M. Briah Nary MD; 05/31/2016 7:10 PM)  General Mental Status-Alert. General Appearance-Consistent with stated age. Hydration-Well hydrated. Voice-Normal. Note: mild obesity  Head and Neck Head-normocephalic, atraumatic with no lesions or palpable masses. Trachea-midline. Thyroid Gland Characteristics - normal size and consistency.  Eye Eyeball - Bilateral-Extraocular movements intact. Sclera/Conjunctiva - Bilateral-No scleral icterus.  Chest and Lung Exam Chest and lung exam reveals -quiet, even and easy respiratory effort with no use of accessory muscles and on auscultation, normal breath sounds, no adventitious sounds and normal vocal resonance. Inspection Chest Wall - Normal. Back - normal.  Breast - Did not examine.  Cardiovascular Cardiovascular examination reveals -normal heart sounds, regular rate and rhythm with no murmurs and normal pedal pulses bilaterally.  Abdomen Inspection Inspection of the abdomen reveals - No Hernias. Skin - Scar - Note: old  c/s scar. Palpation/Percussion Palpation and Percussion of the abdomen reveal - Soft, Non Tender, No Rebound tenderness, No Rigidity (guarding) and No hepatosplenomegaly. Auscultation Auscultation of the abdomen reveals - Bowel sounds normal.  Peripheral Vascular Upper Extremity Palpation - Pulses bilaterally normal.  Neurologic Neurologic evaluation reveals -alert and oriented x 3 with no impairment of recent or remote memory. Mental Status-Normal.  Neuropsychiatric The patient's mood and affect are described as -normal. Judgment and Insight-insight is appropriate concerning matters relevant to self.  Musculoskeletal Normal Exam - Left-Upper Extremity Strength Normal and Lower Extremity Strength Normal. Normal Exam - Right-Upper Extremity Strength Normal and Lower Extremity Strength Normal.  Lymphatic Head & Neck  General Head & Neck Lymphatics: Bilateral - Description - Normal. Axillary - Did not examine. Femoral & Inguinal - Did not examine.    Assessment & Plan Sherry Russell(Ho Parisi M. 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 We review the results of dysplastic polyp in her right colon. It was tattooed. I have recommended right hemicolectomy-laparoscopically in order to remove the polyp as well as to make sure there is no true invasive adenocarcinoma. We discussed if left untreated that this polyp will definitely turn into a malignancy.  I discussed the procedure in detail. The patient was given Agricultural engineereducational material. We discussed the risks and benefits of surgery including, but not limited to bleeding, infection (such as wound infection, abdominal abscess), injury to surrounding structures, blood clot formation, urinary retention, incisional hernia, anastomotic stricture, anastomotic leak, anesthesia risks, pulmonary & cardiac complications such as pneumonia &/or heart attack,  need for additional procedures, ileus, & prolonged hospitalization. We discussed the typical postoperative recovery course, including limitations & restrictions postoperatively. I explained that the likelihood of improvement in their symptoms is good.  The patient has elected to proceed with surgery. She will be scheduled for laparoscopic right hemicolectomy in the near future.  Current Plans Pt Education - Pamphlet Given - Laparoscopic Colorectal Surgery: discussed with patient and provided information. You  are being scheduled for surgery - Our schedulers will call you.  You should hear from our office's scheduling department within 5 working days about the location, date, and time of surgery. We try to make accommodations for patient's preferences in scheduling surgery, but sometimes the OR schedule or the surgeon's schedule prevents [place] from making those accommodations.  If you have not heard from our office [phone]) in 5 working days, call the office and ask for your surgeon's nurse.  If you have other questions about your diagnosis, plan, or surgery, call the office and ask for your surgeon's nurse.  Started Neomycin Sulfate 500MG , 2 (two) Tablet SEE NOTE, #6, 05/31/2016, No Refill. Local Order: TAKE TWO TABLETS AT 2 PM, 3 PM, AND 10 PM THE DAY PRIOR TO SURGERY Started MetroNIDAZOLE 500MG , 1 (one) Tablet Take @ 2pm, 3pm and 10pm the day before surgery, #6, 05/31/2016, No Refill.  Mary Sella. Andrey Campanile, MD, FACS General, Bariatric, & Minimally Invasive Surgery [facility], [place]

## 2016-06-17 DIAGNOSIS — D369 Benign neoplasm, unspecified site: Secondary | ICD-10-CM | POA: Diagnosis not present

## 2016-07-07 DIAGNOSIS — D122 Benign neoplasm of ascending colon: Secondary | ICD-10-CM | POA: Diagnosis not present

## 2016-07-07 DIAGNOSIS — K660 Peritoneal adhesions (postprocedural) (postinfection): Secondary | ICD-10-CM | POA: Diagnosis not present

## 2016-07-07 DIAGNOSIS — D126 Benign neoplasm of colon, unspecified: Secondary | ICD-10-CM | POA: Diagnosis not present

## 2016-07-07 DIAGNOSIS — E669 Obesity, unspecified: Secondary | ICD-10-CM | POA: Diagnosis not present

## 2016-07-07 DIAGNOSIS — Z6833 Body mass index (BMI) 33.0-33.9, adult: Secondary | ICD-10-CM | POA: Diagnosis not present

## 2016-07-07 DIAGNOSIS — D1339 Benign neoplasm of other parts of small intestine: Secondary | ICD-10-CM | POA: Diagnosis not present

## 2016-08-02 DIAGNOSIS — Z9889 Other specified postprocedural states: Secondary | ICD-10-CM | POA: Diagnosis not present

## 2016-08-02 DIAGNOSIS — Z9049 Acquired absence of other specified parts of digestive tract: Secondary | ICD-10-CM | POA: Diagnosis not present

## 2016-08-02 DIAGNOSIS — R1031 Right lower quadrant pain: Secondary | ICD-10-CM | POA: Diagnosis not present

## 2016-08-02 DIAGNOSIS — Z86018 Personal history of other benign neoplasm: Secondary | ICD-10-CM | POA: Diagnosis not present

## 2016-08-02 DIAGNOSIS — R109 Unspecified abdominal pain: Secondary | ICD-10-CM | POA: Diagnosis not present

## 2016-08-02 DIAGNOSIS — R1011 Right upper quadrant pain: Secondary | ICD-10-CM | POA: Diagnosis not present

## 2016-08-16 DIAGNOSIS — R5383 Other fatigue: Secondary | ICD-10-CM | POA: Diagnosis not present

## 2017-05-02 DIAGNOSIS — N3090 Cystitis, unspecified without hematuria: Secondary | ICD-10-CM | POA: Diagnosis not present

## 2017-05-02 DIAGNOSIS — Z01419 Encounter for gynecological examination (general) (routine) without abnormal findings: Secondary | ICD-10-CM | POA: Diagnosis not present

## 2017-05-02 DIAGNOSIS — K529 Noninfective gastroenteritis and colitis, unspecified: Secondary | ICD-10-CM | POA: Diagnosis not present

## 2017-05-02 DIAGNOSIS — Z1231 Encounter for screening mammogram for malignant neoplasm of breast: Secondary | ICD-10-CM | POA: Diagnosis not present

## 2017-05-02 DIAGNOSIS — Z6833 Body mass index (BMI) 33.0-33.9, adult: Secondary | ICD-10-CM | POA: Diagnosis not present

## 2017-08-18 DIAGNOSIS — Z6833 Body mass index (BMI) 33.0-33.9, adult: Secondary | ICD-10-CM | POA: Diagnosis not present

## 2017-08-18 DIAGNOSIS — N959 Unspecified menopausal and perimenopausal disorder: Secondary | ICD-10-CM | POA: Diagnosis not present

## 2017-08-18 DIAGNOSIS — G4700 Insomnia, unspecified: Secondary | ICD-10-CM | POA: Diagnosis not present

## 2017-08-18 DIAGNOSIS — F419 Anxiety disorder, unspecified: Secondary | ICD-10-CM | POA: Diagnosis not present

## 2017-08-31 DIAGNOSIS — K648 Other hemorrhoids: Secondary | ICD-10-CM | POA: Diagnosis not present

## 2017-08-31 DIAGNOSIS — Z9049 Acquired absence of other specified parts of digestive tract: Secondary | ICD-10-CM | POA: Diagnosis not present

## 2017-08-31 DIAGNOSIS — Z85038 Personal history of other malignant neoplasm of large intestine: Secondary | ICD-10-CM | POA: Diagnosis not present

## 2017-08-31 DIAGNOSIS — Z8719 Personal history of other diseases of the digestive system: Secondary | ICD-10-CM | POA: Diagnosis not present

## 2017-08-31 DIAGNOSIS — Z86010 Personal history of colonic polyps: Secondary | ICD-10-CM | POA: Diagnosis not present

## 2017-08-31 DIAGNOSIS — Z1211 Encounter for screening for malignant neoplasm of colon: Secondary | ICD-10-CM | POA: Diagnosis not present

## 2017-09-06 DIAGNOSIS — K648 Other hemorrhoids: Secondary | ICD-10-CM | POA: Diagnosis not present

## 2017-10-30 DIAGNOSIS — Z6833 Body mass index (BMI) 33.0-33.9, adult: Secondary | ICD-10-CM | POA: Diagnosis not present

## 2017-10-30 DIAGNOSIS — R8290 Unspecified abnormal findings in urine: Secondary | ICD-10-CM | POA: Diagnosis not present

## 2017-11-04 DIAGNOSIS — R109 Unspecified abdominal pain: Secondary | ICD-10-CM | POA: Diagnosis not present

## 2017-11-04 DIAGNOSIS — M545 Low back pain: Secondary | ICD-10-CM | POA: Diagnosis not present

## 2018-05-25 ENCOUNTER — Other Ambulatory Visit: Payer: Self-pay | Admitting: Psychiatry

## 2018-05-25 ENCOUNTER — Other Ambulatory Visit: Payer: Self-pay | Admitting: Family Medicine

## 2018-05-25 ENCOUNTER — Ambulatory Visit
Admission: RE | Admit: 2018-05-25 | Discharge: 2018-05-25 | Disposition: A | Discharge status: Home / Self Care | Payer: BLUE CROSS/BLUE SHIELD | Source: Ambulatory Visit | Attending: Family Medicine | Admitting: Family Medicine

## 2018-05-25 DIAGNOSIS — R7303 Prediabetes: Secondary | ICD-10-CM | POA: Diagnosis not present

## 2018-05-25 DIAGNOSIS — R0982 Postnasal drip: Secondary | ICD-10-CM | POA: Diagnosis not present

## 2018-05-25 DIAGNOSIS — Z23 Encounter for immunization: Secondary | ICD-10-CM | POA: Diagnosis not present

## 2018-05-25 DIAGNOSIS — R0789 Other chest pain: Secondary | ICD-10-CM

## 2018-06-08 DIAGNOSIS — R319 Hematuria, unspecified: Secondary | ICD-10-CM | POA: Diagnosis not present

## 2018-06-08 DIAGNOSIS — R0789 Other chest pain: Secondary | ICD-10-CM | POA: Diagnosis not present

## 2018-06-08 DIAGNOSIS — Z6833 Body mass index (BMI) 33.0-33.9, adult: Secondary | ICD-10-CM | POA: Diagnosis not present

## 2018-06-08 DIAGNOSIS — K219 Gastro-esophageal reflux disease without esophagitis: Secondary | ICD-10-CM | POA: Diagnosis not present

## 2018-07-06 DIAGNOSIS — Z0000 Encounter for general adult medical examination without abnormal findings: Secondary | ICD-10-CM | POA: Diagnosis not present

## 2018-07-06 DIAGNOSIS — Z13220 Encounter for screening for lipoid disorders: Secondary | ICD-10-CM | POA: Diagnosis not present

## 2018-07-06 DIAGNOSIS — Z114 Encounter for screening for human immunodeficiency virus [HIV]: Secondary | ICD-10-CM | POA: Diagnosis not present

## 2018-07-06 DIAGNOSIS — Z1329 Encounter for screening for other suspected endocrine disorder: Secondary | ICD-10-CM | POA: Diagnosis not present

## 2018-07-06 DIAGNOSIS — Z6833 Body mass index (BMI) 33.0-33.9, adult: Secondary | ICD-10-CM | POA: Diagnosis not present

## 2018-07-13 DIAGNOSIS — Z0000 Encounter for general adult medical examination without abnormal findings: Secondary | ICD-10-CM | POA: Diagnosis not present

## 2018-07-13 DIAGNOSIS — Z6833 Body mass index (BMI) 33.0-33.9, adult: Secondary | ICD-10-CM | POA: Diagnosis not present

## 2018-08-21 DIAGNOSIS — R3121 Asymptomatic microscopic hematuria: Secondary | ICD-10-CM | POA: Diagnosis not present

## 2018-09-04 DIAGNOSIS — R3129 Other microscopic hematuria: Secondary | ICD-10-CM | POA: Diagnosis not present

## 2018-09-04 DIAGNOSIS — R3121 Asymptomatic microscopic hematuria: Secondary | ICD-10-CM | POA: Diagnosis not present

## 2018-09-17 DIAGNOSIS — R3121 Asymptomatic microscopic hematuria: Secondary | ICD-10-CM | POA: Diagnosis not present

## 2018-10-17 DIAGNOSIS — J069 Acute upper respiratory infection, unspecified: Secondary | ICD-10-CM | POA: Diagnosis not present

## 2018-10-17 DIAGNOSIS — Z719 Counseling, unspecified: Secondary | ICD-10-CM | POA: Diagnosis not present

## 2018-10-17 DIAGNOSIS — J309 Allergic rhinitis, unspecified: Secondary | ICD-10-CM | POA: Diagnosis not present

## 2018-10-22 DIAGNOSIS — Z20828 Contact with and (suspected) exposure to other viral communicable diseases: Secondary | ICD-10-CM | POA: Diagnosis not present

## 2018-10-22 DIAGNOSIS — E860 Dehydration: Secondary | ICD-10-CM | POA: Diagnosis not present

## 2018-10-22 DIAGNOSIS — J069 Acute upper respiratory infection, unspecified: Secondary | ICD-10-CM | POA: Diagnosis not present

## 2018-10-25 DIAGNOSIS — F411 Generalized anxiety disorder: Secondary | ICD-10-CM | POA: Diagnosis not present

## 2018-10-25 DIAGNOSIS — G4700 Insomnia, unspecified: Secondary | ICD-10-CM | POA: Diagnosis not present

## 2018-10-25 DIAGNOSIS — J069 Acute upper respiratory infection, unspecified: Secondary | ICD-10-CM | POA: Diagnosis not present

## 2018-10-29 DIAGNOSIS — G4700 Insomnia, unspecified: Secondary | ICD-10-CM | POA: Diagnosis not present

## 2018-10-29 DIAGNOSIS — F411 Generalized anxiety disorder: Secondary | ICD-10-CM | POA: Diagnosis not present

## 2018-11-20 DIAGNOSIS — K219 Gastro-esophageal reflux disease without esophagitis: Secondary | ICD-10-CM | POA: Diagnosis not present

## 2018-11-20 DIAGNOSIS — F411 Generalized anxiety disorder: Secondary | ICD-10-CM | POA: Diagnosis not present

## 2018-11-20 DIAGNOSIS — G4700 Insomnia, unspecified: Secondary | ICD-10-CM | POA: Diagnosis not present

## 2019-03-05 ENCOUNTER — Other Ambulatory Visit: Payer: Self-pay

## 2019-03-05 DIAGNOSIS — J302 Other seasonal allergic rhinitis: Secondary | ICD-10-CM | POA: Diagnosis not present

## 2019-03-05 DIAGNOSIS — Z20822 Contact with and (suspected) exposure to covid-19: Secondary | ICD-10-CM

## 2019-03-05 DIAGNOSIS — G43009 Migraine without aura, not intractable, without status migrainosus: Secondary | ICD-10-CM | POA: Diagnosis not present

## 2019-03-06 LAB — NOVEL CORONAVIRUS, NAA: SARS-CoV-2, NAA: NOT DETECTED

## 2019-03-07 DIAGNOSIS — H6692 Otitis media, unspecified, left ear: Secondary | ICD-10-CM | POA: Diagnosis not present

## 2019-03-07 DIAGNOSIS — Z20828 Contact with and (suspected) exposure to other viral communicable diseases: Secondary | ICD-10-CM | POA: Diagnosis not present

## 2019-03-07 DIAGNOSIS — R110 Nausea: Secondary | ICD-10-CM | POA: Diagnosis not present

## 2019-03-08 ENCOUNTER — Other Ambulatory Visit: Payer: Self-pay

## 2019-03-08 DIAGNOSIS — Z20822 Contact with and (suspected) exposure to covid-19: Secondary | ICD-10-CM

## 2019-03-09 LAB — NOVEL CORONAVIRUS, NAA: SARS-CoV-2, NAA: NOT DETECTED

## 2019-03-12 DIAGNOSIS — H6692 Otitis media, unspecified, left ear: Secondary | ICD-10-CM | POA: Diagnosis not present

## 2019-03-12 DIAGNOSIS — J329 Chronic sinusitis, unspecified: Secondary | ICD-10-CM | POA: Diagnosis not present

## 2019-03-12 DIAGNOSIS — Z20828 Contact with and (suspected) exposure to other viral communicable diseases: Secondary | ICD-10-CM | POA: Diagnosis not present

## 2019-03-14 DIAGNOSIS — J309 Allergic rhinitis, unspecified: Secondary | ICD-10-CM | POA: Diagnosis not present

## 2019-03-14 DIAGNOSIS — H6692 Otitis media, unspecified, left ear: Secondary | ICD-10-CM | POA: Diagnosis not present

## 2019-03-14 DIAGNOSIS — J069 Acute upper respiratory infection, unspecified: Secondary | ICD-10-CM | POA: Diagnosis not present

## 2019-05-27 DIAGNOSIS — Z1231 Encounter for screening mammogram for malignant neoplasm of breast: Secondary | ICD-10-CM | POA: Diagnosis not present

## 2019-05-27 DIAGNOSIS — Z0000 Encounter for general adult medical examination without abnormal findings: Secondary | ICD-10-CM | POA: Diagnosis not present

## 2019-05-27 DIAGNOSIS — R5383 Other fatigue: Secondary | ICD-10-CM | POA: Diagnosis not present

## 2019-05-27 DIAGNOSIS — Z01419 Encounter for gynecological examination (general) (routine) without abnormal findings: Secondary | ICD-10-CM | POA: Diagnosis not present

## 2019-05-27 DIAGNOSIS — Z6834 Body mass index (BMI) 34.0-34.9, adult: Secondary | ICD-10-CM | POA: Diagnosis not present

## 2019-06-10 DIAGNOSIS — J309 Allergic rhinitis, unspecified: Secondary | ICD-10-CM | POA: Diagnosis not present

## 2019-07-10 DIAGNOSIS — R0600 Dyspnea, unspecified: Secondary | ICD-10-CM | POA: Diagnosis not present

## 2019-07-10 DIAGNOSIS — R519 Headache, unspecified: Secondary | ICD-10-CM | POA: Diagnosis not present

## 2019-07-10 DIAGNOSIS — Z20828 Contact with and (suspected) exposure to other viral communicable diseases: Secondary | ICD-10-CM | POA: Diagnosis not present

## 2019-08-05 DIAGNOSIS — R8290 Unspecified abnormal findings in urine: Secondary | ICD-10-CM | POA: Diagnosis not present

## 2019-08-05 DIAGNOSIS — Z6833 Body mass index (BMI) 33.0-33.9, adult: Secondary | ICD-10-CM | POA: Diagnosis not present

## 2019-11-11 DIAGNOSIS — J00 Acute nasopharyngitis [common cold]: Secondary | ICD-10-CM | POA: Diagnosis not present

## 2019-11-11 DIAGNOSIS — M791 Myalgia, unspecified site: Secondary | ICD-10-CM | POA: Diagnosis not present

## 2020-01-02 DIAGNOSIS — N3000 Acute cystitis without hematuria: Secondary | ICD-10-CM | POA: Diagnosis not present

## 2020-01-02 DIAGNOSIS — R3129 Other microscopic hematuria: Secondary | ICD-10-CM | POA: Diagnosis not present

## 2020-01-02 DIAGNOSIS — E669 Obesity, unspecified: Secondary | ICD-10-CM | POA: Diagnosis not present

## 2020-01-02 DIAGNOSIS — R7303 Prediabetes: Secondary | ICD-10-CM | POA: Diagnosis not present

## 2020-01-08 DIAGNOSIS — E669 Obesity, unspecified: Secondary | ICD-10-CM | POA: Diagnosis not present

## 2020-01-08 DIAGNOSIS — N3000 Acute cystitis without hematuria: Secondary | ICD-10-CM | POA: Diagnosis not present

## 2020-01-08 DIAGNOSIS — R7303 Prediabetes: Secondary | ICD-10-CM | POA: Diagnosis not present

## 2020-01-08 DIAGNOSIS — R3129 Other microscopic hematuria: Secondary | ICD-10-CM | POA: Diagnosis not present

## 2020-01-22 DIAGNOSIS — G4700 Insomnia, unspecified: Secondary | ICD-10-CM | POA: Diagnosis not present

## 2020-01-22 DIAGNOSIS — F4321 Adjustment disorder with depressed mood: Secondary | ICD-10-CM | POA: Diagnosis not present

## 2020-01-22 DIAGNOSIS — F331 Major depressive disorder, recurrent, moderate: Secondary | ICD-10-CM | POA: Diagnosis not present

## 2020-01-22 DIAGNOSIS — F411 Generalized anxiety disorder: Secondary | ICD-10-CM | POA: Diagnosis not present

## 2020-02-06 DIAGNOSIS — N3000 Acute cystitis without hematuria: Secondary | ICD-10-CM | POA: Diagnosis not present

## 2020-02-06 DIAGNOSIS — F331 Major depressive disorder, recurrent, moderate: Secondary | ICD-10-CM | POA: Diagnosis not present

## 2020-02-06 DIAGNOSIS — R319 Hematuria, unspecified: Secondary | ICD-10-CM | POA: Diagnosis not present

## 2020-02-06 DIAGNOSIS — F411 Generalized anxiety disorder: Secondary | ICD-10-CM | POA: Diagnosis not present

## 2020-02-06 DIAGNOSIS — R3129 Other microscopic hematuria: Secondary | ICD-10-CM | POA: Diagnosis not present

## 2020-05-08 ENCOUNTER — Ambulatory Visit: Payer: Self-pay | Attending: Internal Medicine

## 2020-05-08 ENCOUNTER — Other Ambulatory Visit (HOSPITAL_BASED_OUTPATIENT_CLINIC_OR_DEPARTMENT_OTHER): Payer: Self-pay | Admitting: Internal Medicine

## 2020-05-08 DIAGNOSIS — Z23 Encounter for immunization: Secondary | ICD-10-CM

## 2020-05-08 MED FILL — FLUARIX QUADRIVALENT 0.5 ML: 0.5 | 1 days supply | Qty: 1 | Fill #0

## 2020-05-15 MED FILL — PFIZER-BIONTECH COVID-19 VA: 30 | 1 days supply | Qty: 0 | Fill #0

## 2020-07-20 DIAGNOSIS — J0100 Acute maxillary sinusitis, unspecified: Secondary | ICD-10-CM | POA: Diagnosis not present

## 2020-07-20 DIAGNOSIS — Z20828 Contact with and (suspected) exposure to other viral communicable diseases: Secondary | ICD-10-CM | POA: Diagnosis not present

## 2020-07-23 ENCOUNTER — Other Ambulatory Visit: Payer: Self-pay

## 2020-07-23 ENCOUNTER — Emergency Department (HOSPITAL_BASED_OUTPATIENT_CLINIC_OR_DEPARTMENT_OTHER)
Admission: EM | Admit: 2020-07-23 | Discharge: 2020-07-23 | Disposition: A | Discharge status: Home / Self Care | Payer: BC Managed Care – PPO | Attending: Emergency Medicine | Admitting: Emergency Medicine

## 2020-07-23 ENCOUNTER — Emergency Department (HOSPITAL_BASED_OUTPATIENT_CLINIC_OR_DEPARTMENT_OTHER): Payer: BC Managed Care – PPO

## 2020-07-23 DIAGNOSIS — Z20822 Contact with and (suspected) exposure to covid-19: Secondary | ICD-10-CM | POA: Diagnosis not present

## 2020-07-23 DIAGNOSIS — R0789 Other chest pain: Secondary | ICD-10-CM | POA: Insufficient documentation

## 2020-07-23 DIAGNOSIS — Z7982 Long term (current) use of aspirin: Secondary | ICD-10-CM | POA: Diagnosis not present

## 2020-07-23 DIAGNOSIS — R059 Cough, unspecified: Secondary | ICD-10-CM | POA: Diagnosis not present

## 2020-07-23 DIAGNOSIS — B349 Viral infection, unspecified: Secondary | ICD-10-CM | POA: Insufficient documentation

## 2020-07-23 LAB — CBC WITH DIFFERENTIAL/PLATELET
Abs Immature Granulocytes: 0 10*3/uL (ref 0.00–0.07)
Basophils Absolute: 0 10*3/uL (ref 0.0–0.1)
Basophils Relative: 1 %
Eosinophils Absolute: 0.1 10*3/uL (ref 0.0–0.5)
Eosinophils Relative: 2 %
HCT: 35.1 % — ABNORMAL LOW (ref 36.0–46.0)
Hemoglobin: 12.1 g/dL (ref 12.0–15.0)
Immature Granulocytes: 0 %
Lymphocytes Relative: 58 %
Lymphs Abs: 3.1 10*3/uL (ref 0.7–4.0)
MCH: 30.7 pg (ref 26.0–34.0)
MCHC: 34.5 g/dL (ref 30.0–36.0)
MCV: 89.1 fL (ref 80.0–100.0)
Monocytes Absolute: 0.3 10*3/uL (ref 0.1–1.0)
Monocytes Relative: 6 %
Neutro Abs: 1.7 10*3/uL (ref 1.7–7.7)
Neutrophils Relative %: 33 %
Platelets: 307 10*3/uL (ref 150–400)
RBC: 3.94 MIL/uL (ref 3.87–5.11)
RDW: 11.9 % (ref 11.5–15.5)
WBC: 5.2 10*3/uL (ref 4.0–10.5)
nRBC: 0 % (ref 0.0–0.2)

## 2020-07-23 LAB — COMPREHENSIVE METABOLIC PANEL
ALT: 17 U/L (ref 0–44)
AST: 24 U/L (ref 15–41)
Albumin: 3.7 g/dL (ref 3.5–5.0)
Alkaline Phosphatase: 53 U/L (ref 38–126)
Anion gap: 9 (ref 5–15)
BUN: 7 mg/dL (ref 6–20)
CO2: 25 mmol/L (ref 22–32)
Calcium: 9.1 mg/dL (ref 8.9–10.3)
Chloride: 104 mmol/L (ref 98–111)
Creatinine, Ser: 0.86 mg/dL (ref 0.44–1.00)
GFR, Estimated: >60 mL/min (ref >60)
Glucose, Bld: 103 mg/dL — ABNORMAL HIGH (ref 70–99)
Potassium: 3.7 mmol/L (ref 3.5–5.1)
Sodium: 138 mmol/L (ref 135–145)
Total Bilirubin: 0.7 mg/dL (ref 0.3–1.2)
Total Protein: 7.2 g/dL (ref 6.5–8.1)

## 2020-07-23 LAB — SARS CORONAVIRUS 2 (TAT 6-24 HRS): SARS Coronavirus 2: NEGATIVE

## 2020-07-23 LAB — TROPONIN I (HIGH SENSITIVITY): Troponin I (High Sensitivity): <2 ng/L (ref <18)

## 2020-07-23 NOTE — ED Provider Notes (Signed)
[facility] EMERGENCY DEPARTMENT Provider Note   CSN: [id number] Arrival date & time: 07/23/20  1040     History No chief complaint on file.   Sherry Russell is a 55 y.o. female.  HPI    Patient with no significant medical history presents to the emergency department with chief complaint of URI-like symptoms.  Patient states symptoms started on Saturday and have progressively gotten worse.  She endorses nasal congestion, sore throat, nonproductive cough, chest tightness, and subjective fevers and chills.  Patient states she feels chest tightness when she lays down and will have to cough.  she has no cardiac history, denies chest pain on exertion or denies chest pain.  Patient has been taking over-the-counter pain medication without any relief.  She endorses that she is vaccinated against COVID-19, is not immunocompromise, does state that she had a recent exposure to COVID-19.  She took a home test which came back negative.  She denies alleviating factors, she is tolerant p.o. at difficulty.  Patient denies headaches, sore throat, chest pain, abdominal pain, nausea, vomiting, diarrhea, pedal edema.  Past Medical History:  Diagnosis Date  . Arrhythmia   . Back injury   . Hypoglycemia     There are no problems to display for this patient.   Past Surgical History:  Procedure Laterality Date  . ABDOMINAL HYSTERECTOMY    . BTL       OB History   No obstetric history on file.     No family history on file.  Social History   Tobacco Use  . Smoking status: Never Smoker  Substance Use Topics  . Alcohol use: No  . Drug use: No    Home Medications Prior to Admission medications   Medication Sig Start Date End Date Taking? Authorizing Provider  aspirin EC 81 MG tablet Take 81 mg by mouth every 4 (four) hours as needed for mild pain or moderate pain.    [provider]  HYDROcodone-acetaminophen (NORCO/VICODIN) 5-325 MG tablet Take 1-2 tablets by mouth every  6 (six) hours as needed for moderate pain or severe pain. 03/09/16   Molpus, John, MD  meloxicam (MOBIC) 15 MG tablet Take 1 tablet (15 mg total) by mouth daily. 03/09/16   Molpus, John, MD    Allergies    Shrimp [shellfish allergy] and Penicillins  Review of Systems   Review of Systems  Constitutional: Negative for chills and fever.  HENT: Positive for congestion. Negative for sore throat.   Respiratory: Positive for cough and chest tightness. Negative for shortness of breath.   Cardiovascular: Negative for chest pain.  Gastrointestinal: Negative for abdominal pain, diarrhea, nausea and vomiting.  Genitourinary: Negative for enuresis.  Musculoskeletal: Negative for back pain.  Skin: Negative for rash.  Neurological: Negative for headaches.  Hematological: Does not bruise/bleed easily.    Physical Exam Updated Vital Signs BP 122/72 (BP Location: Left Arm)   Pulse 75   Temp 98.4 F (36.9 C) (Oral)   Resp (!) 22   Ht 5\' 8"  (1.727 m)   Wt 102.1 kg   SpO2 100%   BMI 34.21 kg/m   Physical Exam Vitals and nursing note reviewed.  Constitutional:      General: She is not in acute distress.    Appearance: She is not ill-appearing.  HENT:     Head: Normocephalic and atraumatic.     Right Ear: Tympanic membrane, ear canal and external ear normal.     Left Ear: Tympanic membrane, ear  canal and external ear normal.     Nose: Congestion present.     Comments: Patient has nasal congestion present and erythematous turbinates bilaterally    Mouth/Throat:     Mouth: Mucous membranes are moist.     Pharynx: Oropharynx is clear. No oropharyngeal exudate or posterior oropharyngeal erythema.  Eyes:     Conjunctiva/sclera: Conjunctivae normal.  Cardiovascular:     Rate and Rhythm: Normal rate and regular rhythm.     Pulses: Normal pulses.     Heart sounds: No murmur heard. No friction rub. No gallop.   Pulmonary:     Effort: No respiratory distress.     Breath sounds: No wheezing,  rhonchi or rales.  Abdominal:     Palpations: Abdomen is soft.     Tenderness: There is no abdominal tenderness.  Musculoskeletal:     Right lower leg: No edema.     Left lower leg: No edema.     Comments: Patient is moving all 4 extremities at difficulty.  Skin:    General: Skin is warm and dry.  Neurological:     Mental Status: She is alert.  Psychiatric:        Mood and Affect: Mood normal.     ED Results / Procedures / Treatments   Labs (all labs ordered are listed, but only abnormal results are displayed) Labs Reviewed  CBC WITH DIFFERENTIAL/PLATELET - Abnormal; Notable for the following components:      Result Value   HCT 35.1 (*)    All other components within normal limits  COMPREHENSIVE METABOLIC PANEL - Abnormal; Notable for the following components:   Glucose, Bld 103 (*)    All other components within normal limits  SARS CORONAVIRUS 2 (TAT 6-24 HRS)  TROPONIN I (HIGH SENSITIVITY)  TROPONIN I (HIGH SENSITIVITY)    EKG EKG Interpretation  Date/Time:  Thursday July 23 2020 10:54:21 EST Ventricular Rate:  76 PR Interval:    QRS Duration: 91 QT Interval:  406 QTC Calculation: 457 R Axis:   67 Text Interpretation: Sinus rhythm Minimal ST elevation, anterior leads No significant change since last tracing Confirmed by Susy Frizzle [phone]) on 07/23/2020 11:15:13 AM   Radiology DG Chest Port 1 View  Result Date: 07/23/2020 CLINICAL DATA:  Cough EXAM: PORTABLE CHEST 1 VIEW COMPARISON:  05/25/2018 FINDINGS: The heart size and mediastinal contours are within normal limits. Both lungs are clear. The visualized skeletal structures are unremarkable. IMPRESSION: No active disease. Electronically Signed   By: Marlan Palau M.D.   On: 07/23/2020 12:37    Procedures Procedures (including critical care time)  Medications Ordered in ED Medications - No data to display  ED Course  I have reviewed the triage vital signs and the nursing notes.  Pertinent labs &  imaging results that were available during my care of the patient were reviewed by me and considered in my medical decision making (see chart for details).    MDM Rules/Calculators/A&P                          Patient presents with URI-like symptoms.  She is alert, does not appear in acute distress, vital signs reassuring.  Will obtain basic chest pain work-up, chest x-ray EKG, Covid test.  CBC negative for leukocytosis or signs anemia.  CMP negative for electrolyte abnormalities, slight hyperglycemia of 103, no AKI, no anion gap present.  This troponin is less than 2.  Chest x-ray negative for acute  findings.  EKG sinus without signs of ischemia no ST elevation depression noted.  I have low suspicion for ACS as history is atypical, patient has no cardiac history, EKG was sinus rhythm without signs of ischemia, initial troponin is 2, will defer second opponent as patient had chest pain since Friday.  Low suspicion for PE as patient denies pleuritic chest pain, shortness of breath, patient denies leg pain, no pedal edema noted on exam vital signs reassuring. Low suspicion for systemic infection as patient is nontoxic-appearing, vital signs reassuring, no obvious source infection noted on exam.  Low suspicion for pneumonia as lung sounds are clear bilaterally, x-ray did not reveal any acute findings. low suspicion for strep throat as oropharynx was visualized, no erythema or exudates noted.  Low suspicion patient would need  hospitalized due to viral infection or Covid as vital signs reassuring, patient is not in respiratory distress.  Suspect patient suffering from viral URI.  Will recommend over-the-counter pain medications follow-up with PCP for further evaluation.  Patient does not meet criteria for infusion clinic if Covid positive.  Vital signs have remained stable, no indication for hospital admission. Patient given at home care as well strict return precautions.  Patient verbalized that they  understood agreed to said plan.  Final Clinical Impression(s) / ED Diagnoses Final diagnoses:  Viral illness    Rx / DC Orders ED Discharge Orders    None       Carroll Sage, PA-C 07/23/20 1323    Pollyann Savoy, MD 07/23/20 1451

## 2020-07-23 NOTE — Discharge Instructions (Addendum)
You have been seen here for URI like symptoms.  I recommend taking Tylenol for fever control and ibuprofen for pain control please follow dosing on the back of bottle.  I recommend staying hydrated and if you do not an appetite, I recommend soups as this will provide you with fluids and calories.  Your Covid test is pending I recommend self quarantine until you get your results back on MyChart.    If you are Covid positive you must self quarantine for 5 days starting on symptom onset, if at the end of those 5 days you are feeling better you may return back to school/work, if you continue to have symptoms you must self quarantine for additional 5 days.  I would like you to contact "post Covid care" as they will provide you with information how to manage your Covid symptoms if you are Covid positive.  Or if you are Covid negative and continue of symptoms after 1 to 2 weeks you may follow-up with your primary care provider  Come back to the emergency department if you develop chest pain, shortness of breath, severe abdominal pain, uncontrolled nausea, vomiting, diarrhea.  

## 2020-09-13 ENCOUNTER — Encounter (HOSPITAL_BASED_OUTPATIENT_CLINIC_OR_DEPARTMENT_OTHER): Payer: Self-pay | Admitting: Emergency Medicine

## 2020-09-13 ENCOUNTER — Emergency Department (HOSPITAL_BASED_OUTPATIENT_CLINIC_OR_DEPARTMENT_OTHER): Payer: BC Managed Care – PPO

## 2020-09-13 ENCOUNTER — Other Ambulatory Visit: Payer: Self-pay

## 2020-09-13 ENCOUNTER — Emergency Department (HOSPITAL_BASED_OUTPATIENT_CLINIC_OR_DEPARTMENT_OTHER)
Admission: EM | Admit: 2020-09-13 | Discharge: 2020-09-13 | Disposition: A | Discharge status: Home / Self Care | Payer: BC Managed Care – PPO | Attending: Emergency Medicine | Admitting: Emergency Medicine

## 2020-09-13 DIAGNOSIS — W208XXA Other cause of strike by thrown, projected or falling object, initial encounter: Secondary | ICD-10-CM | POA: Diagnosis not present

## 2020-09-13 DIAGNOSIS — Z7982 Long term (current) use of aspirin: Secondary | ICD-10-CM | POA: Insufficient documentation

## 2020-09-13 DIAGNOSIS — Z043 Encounter for examination and observation following other accident: Secondary | ICD-10-CM | POA: Diagnosis not present

## 2020-09-13 DIAGNOSIS — Y92512 Supermarket, store or market as the place of occurrence of the external cause: Secondary | ICD-10-CM | POA: Diagnosis not present

## 2020-09-13 DIAGNOSIS — S9031XA Contusion of right foot, initial encounter: Secondary | ICD-10-CM | POA: Insufficient documentation

## 2020-09-13 DIAGNOSIS — S99921A Unspecified injury of right foot, initial encounter: Secondary | ICD-10-CM | POA: Diagnosis not present

## 2020-09-13 HISTORY — DX: Anxiety disorder, unspecified: F41.9

## 2020-09-13 HISTORY — DX: Depression, unspecified: F32.A

## 2020-09-13 NOTE — ED Triage Notes (Signed)
States," A metal shelf fell on top of my right foot last night" Pain to top and outer aspect of right foot .

## 2020-09-13 NOTE — ED Provider Notes (Signed)
[facility] EMERGENCY DEPARTMENT Provider Note   CSN: [id number] Arrival date & time: 09/13/20  9509     History Chief Complaint  Patient presents with   Foot Pain     Sherry Russell is a 55 y.o. female presenting to the emergency department with complaint of right foot pain that began yesterday.  Patient states she was at a store and picked up something.  A piece of wood fell out of the box and landed on the top of her right foot.  She is having pain to the lateral aspect of her dorsal foot.  Pain is worse with weightbearing and going up the stairs.  She applied a topical pain medication for some symptom relief.  No numbness or wounds  The history is provided by the patient.       Past Medical History:  Diagnosis Date   Anxiety    Arrhythmia    Back injury    Depression    Hypoglycemia     There are no problems to display for this patient.   Past Surgical History:  Procedure Laterality Date   ABDOMINAL HYSTERECTOMY     BTL     COLECTOMY       OB History   No obstetric history on file.     No family history on file.  Social History   Tobacco Use   Smoking status: Never Smoker   Smokeless tobacco: Never Used  Substance Use Topics   Alcohol use: Yes    Comment: social   Drug use: No    Home Medications Prior to Admission medications   Medication Sig Start Date End Date Taking? Authorizing Provider  aspirin EC 81 MG tablet Take 81 mg by mouth every 4 (four) hours as needed for mild pain or moderate pain.   Yes [provider]  estradiol (ESTRACE) 1 MG tablet estradiol 1 mg tablet  TAKE 1 TABLET(S) EVERY DAY BY ORAL ROUTE.   Yes [provider]  sertraline (ZOLOFT) 50 MG tablet Take 50 mg by mouth daily. 06/27/20  Yes [provider]  HYDROcodone-acetaminophen (NORCO/VICODIN) 5-325 MG tablet Take 1-2 tablets by mouth every 6 (six) hours as needed for moderate pain or severe pain. 03/09/16   Molpus, John, MD   meloxicam (MOBIC) 15 MG tablet Take 1 tablet (15 mg total) by mouth daily. 03/09/16   Molpus, Jonny Ruiz, MD    Allergies    Shrimp [shellfish allergy] and Penicillins  Review of Systems   Review of Systems  All other systems reviewed and are negative.   Physical Exam Updated Vital Signs BP 125/78 (BP Location: Right Arm)    Pulse 86    Temp 98.2 F (36.8 C) (Oral)    Resp 14    Ht 5\' 8"  (1.727 m)    Wt 99.8 kg    SpO2 100%    BMI 33.45 kg/m   Physical Exam Vitals and nursing note reviewed.  Constitutional:      Appearance: She is well-developed.  HENT:     Head: Normocephalic and atraumatic.  Eyes:     Conjunctiva/sclera: Conjunctivae normal.  Cardiovascular:     Rate and Rhythm: Normal rate.  Pulmonary:     Effort: Pulmonary effort is normal.  Musculoskeletal:     Comments: Dorsal right foot with some mild swelling noted to the lateral aspect along the tarsal bones.  No deformity or wounds.  Normal range of motion of the ankle.  Achilles is intact.  Intact  DP pulse and distal sensation.  Neurological:     Mental Status: She is alert.  Psychiatric:        Mood and Affect: Mood normal.        Behavior: Behavior normal.     ED Results / Procedures / Treatments   Labs (all labs ordered are listed, but only abnormal results are displayed) Labs Reviewed - No data to display  EKG None  Radiology DG Foot Complete Right  Result Date: 09/13/2020 CLINICAL DATA:  A metal object fell on patient's foot. EXAM: RIGHT FOOT COMPLETE - 3+ VIEW COMPARISON:  None. FINDINGS: There is no evidence of fracture or dislocation. There is no evidence of arthropathy or other focal bone abnormality. Soft tissues are unremarkable. IMPRESSION: Negative. Electronically Signed   By: Ted Mcalpine M.D.   On: 09/13/2020 10:21    Procedures Procedures   Medications Ordered in ED Medications - No data to display  ED Course  I have reviewed the triage vital signs and the nursing  notes.  Pertinent labs & imaging results that were available during my care of the patient were reviewed by me and considered in my medical decision making (see chart for details).    MDM Rules/Calculators/A&P                          Patient with contusion to the right foot after dropping an object on it yesterday.  X-rays negative for fracture.  Neurovascular intact.  Supportive measures including RICE therapy, NSAIDs or Tylenol for pain.  PCP follow-up as needed.  Discussed results, findings, treatment and follow up. Patient advised of return precautions. Patient verbalized understanding and agreed with plan.  Final Clinical Impression(s) / ED Diagnoses Final diagnoses:  Contusion of right foot, initial encounter    Rx / DC Orders ED Discharge Orders    None       Xian Alves, [place] N, PA-C 09/13/20 1040    Pollyann Savoy, MD 09/14/20 [phone]

## 2020-09-13 NOTE — ED Notes (Signed)
XR at bedside

## 2020-09-13 NOTE — Discharge Instructions (Signed)
Your x-ray is normal. Apply ice to your foot for 20 minutes at a time. Elevate it to help prevent swelling. You can take ibuprofen or tylenol every 6 hours as needed for pain. Schedule an appointment with your primary care to follow-up on your injury as needed. Return to the ER for new or concerning symptoms.

## 2020-11-20 DIAGNOSIS — E119 Type 2 diabetes mellitus without complications: Secondary | ICD-10-CM | POA: Diagnosis not present

## 2020-11-20 DIAGNOSIS — R350 Frequency of micturition: Secondary | ICD-10-CM | POA: Diagnosis not present

## 2020-11-20 DIAGNOSIS — K529 Noninfective gastroenteritis and colitis, unspecified: Secondary | ICD-10-CM | POA: Diagnosis not present

## 2020-12-04 DIAGNOSIS — R7303 Prediabetes: Secondary | ICD-10-CM | POA: Diagnosis not present

## 2020-12-04 DIAGNOSIS — E669 Obesity, unspecified: Secondary | ICD-10-CM | POA: Diagnosis not present

## 2020-12-29 DIAGNOSIS — Z0000 Encounter for general adult medical examination without abnormal findings: Secondary | ICD-10-CM | POA: Diagnosis not present

## 2020-12-31 DIAGNOSIS — Z0000 Encounter for general adult medical examination without abnormal findings: Secondary | ICD-10-CM | POA: Diagnosis not present

## 2020-12-31 DIAGNOSIS — Z23 Encounter for immunization: Secondary | ICD-10-CM | POA: Diagnosis not present

## 2020-12-31 DIAGNOSIS — F331 Major depressive disorder, recurrent, moderate: Secondary | ICD-10-CM | POA: Diagnosis not present

## 2020-12-31 DIAGNOSIS — Z1211 Encounter for screening for malignant neoplasm of colon: Secondary | ICD-10-CM | POA: Diagnosis not present

## 2020-12-31 DIAGNOSIS — G4700 Insomnia, unspecified: Secondary | ICD-10-CM | POA: Diagnosis not present

## 2020-12-31 DIAGNOSIS — E782 Mixed hyperlipidemia: Secondary | ICD-10-CM | POA: Diagnosis not present

## 2020-12-31 DIAGNOSIS — F411 Generalized anxiety disorder: Secondary | ICD-10-CM | POA: Diagnosis not present

## 2021-01-01 DIAGNOSIS — R7303 Prediabetes: Secondary | ICD-10-CM | POA: Diagnosis not present

## 2021-01-01 DIAGNOSIS — E785 Hyperlipidemia, unspecified: Secondary | ICD-10-CM | POA: Diagnosis not present

## 2021-01-01 DIAGNOSIS — E669 Obesity, unspecified: Secondary | ICD-10-CM | POA: Diagnosis not present

## 2021-01-19 DIAGNOSIS — M7541 Impingement syndrome of right shoulder: Secondary | ICD-10-CM | POA: Diagnosis not present

## 2021-01-19 DIAGNOSIS — R109 Unspecified abdominal pain: Secondary | ICD-10-CM | POA: Diagnosis not present

## 2021-01-19 DIAGNOSIS — K5900 Constipation, unspecified: Secondary | ICD-10-CM | POA: Diagnosis not present

## 2021-01-19 DIAGNOSIS — K219 Gastro-esophageal reflux disease without esophagitis: Secondary | ICD-10-CM | POA: Diagnosis not present

## 2021-01-19 DIAGNOSIS — E119 Type 2 diabetes mellitus without complications: Secondary | ICD-10-CM | POA: Diagnosis not present

## 2021-02-01 DIAGNOSIS — M7541 Impingement syndrome of right shoulder: Secondary | ICD-10-CM | POA: Diagnosis not present

## 2021-02-01 DIAGNOSIS — E669 Obesity, unspecified: Secondary | ICD-10-CM | POA: Diagnosis not present

## 2021-02-02 ENCOUNTER — Emergency Department (HOSPITAL_BASED_OUTPATIENT_CLINIC_OR_DEPARTMENT_OTHER): Payer: BC Managed Care – PPO

## 2021-02-02 ENCOUNTER — Encounter (HOSPITAL_BASED_OUTPATIENT_CLINIC_OR_DEPARTMENT_OTHER): Payer: Self-pay | Admitting: Emergency Medicine

## 2021-02-02 ENCOUNTER — Other Ambulatory Visit: Payer: Self-pay

## 2021-02-02 ENCOUNTER — Emergency Department (HOSPITAL_BASED_OUTPATIENT_CLINIC_OR_DEPARTMENT_OTHER)
Admission: EM | Admit: 2021-02-02 | Discharge: 2021-02-02 | Disposition: A | Discharge status: Home / Self Care | Payer: BC Managed Care – PPO | Attending: Emergency Medicine | Admitting: Emergency Medicine

## 2021-02-02 DIAGNOSIS — M7591 Shoulder lesion, unspecified, right shoulder: Secondary | ICD-10-CM | POA: Diagnosis not present

## 2021-02-02 DIAGNOSIS — M25511 Pain in right shoulder: Secondary | ICD-10-CM | POA: Diagnosis not present

## 2021-02-02 DIAGNOSIS — R0789 Other chest pain: Secondary | ICD-10-CM | POA: Insufficient documentation

## 2021-02-02 DIAGNOSIS — M898X1 Other specified disorders of bone, shoulder: Secondary | ICD-10-CM

## 2021-02-02 DIAGNOSIS — M546 Pain in thoracic spine: Secondary | ICD-10-CM | POA: Insufficient documentation

## 2021-02-02 DIAGNOSIS — R7989 Other specified abnormal findings of blood chemistry: Secondary | ICD-10-CM | POA: Diagnosis not present

## 2021-02-02 DIAGNOSIS — M89311 Hypertrophy of bone, right shoulder: Secondary | ICD-10-CM | POA: Diagnosis not present

## 2021-02-02 DIAGNOSIS — R079 Chest pain, unspecified: Secondary | ICD-10-CM | POA: Diagnosis not present

## 2021-02-02 DIAGNOSIS — M25771 Osteophyte, right ankle: Secondary | ICD-10-CM | POA: Diagnosis not present

## 2021-02-02 DIAGNOSIS — M549 Dorsalgia, unspecified: Secondary | ICD-10-CM | POA: Diagnosis not present

## 2021-02-02 DIAGNOSIS — R002 Palpitations: Secondary | ICD-10-CM | POA: Diagnosis not present

## 2021-02-02 DIAGNOSIS — M7581 Other shoulder lesions, right shoulder: Secondary | ICD-10-CM

## 2021-02-02 LAB — HEPATIC FUNCTION PANEL
ALT: 26 U/L (ref 0–44)
AST: 21 U/L (ref 15–41)
Albumin: 4 g/dL (ref 3.5–5.0)
Alkaline Phosphatase: 48 U/L (ref 38–126)
Bilirubin, Direct: 0.1 mg/dL (ref 0.0–0.2)
Indirect Bilirubin: 0.6 mg/dL (ref 0.3–0.9)
Total Bilirubin: 0.7 mg/dL (ref 0.3–1.2)
Total Protein: 7.3 g/dL (ref 6.5–8.1)

## 2021-02-02 LAB — CBC
HCT: 34.8 % — ABNORMAL LOW (ref 36.0–46.0)
Hemoglobin: 11.8 g/dL — ABNORMAL LOW (ref 12.0–15.0)
MCH: 30.9 pg (ref 26.0–34.0)
MCHC: 33.9 g/dL (ref 30.0–36.0)
MCV: 91.1 fL (ref 80.0–100.0)
Platelets: 283 10*3/uL (ref 150–400)
RBC: 3.82 MIL/uL — ABNORMAL LOW (ref 3.87–5.11)
RDW: 12.6 % (ref 11.5–15.5)
WBC: 7.9 10*3/uL (ref 4.0–10.5)
nRBC: 0 % (ref 0.0–0.2)

## 2021-02-02 LAB — BASIC METABOLIC PANEL
Anion gap: 7 (ref 5–15)
BUN: 16 mg/dL (ref 6–20)
CO2: 28 mmol/L (ref 22–32)
Calcium: 9.5 mg/dL (ref 8.9–10.3)
Chloride: 105 mmol/L (ref 98–111)
Creatinine, Ser: 0.92 mg/dL (ref 0.44–1.00)
GFR, Estimated: >60 mL/min (ref >60)
Glucose, Bld: 90 mg/dL (ref 70–99)
Potassium: 3.7 mmol/L (ref 3.5–5.1)
Sodium: 140 mmol/L (ref 135–145)

## 2021-02-02 LAB — TROPONIN I (HIGH SENSITIVITY)
Troponin I (High Sensitivity): <2 ng/L (ref <18)
Troponin I (High Sensitivity): 3 ng/L (ref <18)

## 2021-02-02 LAB — D-DIMER, QUANTITATIVE: D-Dimer, Quant: 1.2 ug/mL-FEU — ABNORMAL HIGH (ref 0.00–0.50)

## 2021-02-02 MED ORDER — IOHEXOL 350 MG/ML SOLN
100.0000 mL | Freq: Once | INTRAVENOUS | Status: AC | PRN
  Administered 2021-02-02: 100 mL via INTRAVENOUS

## 2021-02-02 MED ORDER — OXYCODONE-ACETAMINOPHEN 5-325 MG PO TABS: 1.0000 | ORAL_TABLET | Freq: Four times a day (QID) | ORAL | 0 refills | Status: DC | PRN

## 2021-02-02 MED ORDER — ACETAMINOPHEN 500 MG PO TABS
1000.0000 mg | ORAL_TABLET | Freq: Once | ORAL | Status: AC
  Administered 2021-02-02: 1000 mg via ORAL
  Filled 2021-02-02: qty 2

## 2021-02-02 NOTE — ED Triage Notes (Signed)
Pt arrives pov with c/o R arm pain with back pain and CP x 3 weeks. Pt reports primary back and side pain. Endorses pcp dx of injured rotator cuff. Pt reports shob when pain increases.Pt denies n/v

## 2021-02-02 NOTE — Discharge Instructions (Addendum)
Please follow up with Dr. Jordan Likes sports medicine for further evaluation of your scapular pain/spurs seen on xray.  Use shoulder sling as needed. You can continue taking 800 mg Ibuprofen every 8 hours as needed for pain. I have prescribed a short course of narcotic pain medication to take for break through pain.   Return to the ED for any new/worsening symptoms

## 2021-02-02 NOTE — ED Notes (Signed)
Patient reports  Right subscapular pain radiating around to right chest x 3 weeks.

## 2021-02-02 NOTE — ED Notes (Signed)
Patient to xray.

## 2021-02-02 NOTE — ED Provider Notes (Signed)
[facility] EMERGENCY DEPARTMENT Provider Note   CSN: [id number] Arrival date & time: 02/02/21  0950     History Chief Complaint  Patient presents with   Chest Pain    Sherry Russell is a 55 y.o. female who presents to the ED today with complaint of gradual onset, constant, sharp, R upper back/scapular pain radiating into chest for the past 3 weeks.  Patient reports waking up with the pain.  She denies any increase in activity or fall prior to pain beginning.  She states when the pain is at its worst she will feel short of breath.  She went to her PCP and states she had an injection into her arm with only mild relief.  She states she was told she had a rotator cuff injury. She states that she also had lab work done and was told that she tested positive for H. pylori and is currently undergoing treatment for same.  She is unsure if her shoulder pain is related to her age pylori infection.  She denies any nausea or vomiting.  She has been taking 800 mg of ibuprofen without much relief prompting ED visit today.  She denies any weakness, numbness, tingling into her upper extremities.  She denies any history of DVT/PE.  No recent prolonged travel or immobilization.  No exogenous hormone use.  No active malignancy.  No hemoptysis. Pt is a never smoker.   The history is provided by the patient and medical records.      Past Medical History:  Diagnosis Date   Anxiety    Arrhythmia    Back injury    Depression    Hypoglycemia     There are no problems to display for this patient.   Past Surgical History:  Procedure Laterality Date   ABDOMINAL HYSTERECTOMY     BTL     COLECTOMY       OB History   No obstetric history on file.     History reviewed. No pertinent family history.  Social History   Tobacco Use   Smoking status: Never   Smokeless tobacco: Never  Substance Use Topics   Alcohol use: Not Currently    Comment: social   Drug use: No    Home  Medications Prior to Admission medications   Medication Sig Start Date End Date Taking? Authorizing Provider  oxyCODONE-acetaminophen (PERCOCET/ROXICET) 5-325 MG tablet Take 1 tablet by mouth every 6 (six) hours as needed for severe pain. 02/02/21  Yes Tanda Rockers, PA-C  aspirin EC 81 MG tablet Take 81 mg by mouth every 4 (four) hours as needed for mild pain or moderate pain.    [provider]  COVID-19 mRNA vaccine, Pfizer, 30 MCG/0.3ML injection INJECT AS DIRECTED 05/08/20 05/08/21  Judyann Munson, MD  estradiol (ESTRACE) 1 MG tablet estradiol 1 mg tablet  TAKE 1 TABLET(S) EVERY DAY BY ORAL ROUTE.    [provider]  HYDROcodone-acetaminophen (NORCO/VICODIN) 5-325 MG tablet Take 1-2 tablets by mouth every 6 (six) hours as needed for moderate pain or severe pain. 03/09/16   Molpus, John, MD  influenza vac split quadrivalent PF (FLUARIX) 0.5 ML injection TAKE AS DIRECTED 05/08/20 05/08/21  Judyann Munson, MD  meloxicam (MOBIC) 15 MG tablet Take 1 tablet (15 mg total) by mouth daily. 03/09/16   Molpus, John, MD  sertraline (ZOLOFT) 50 MG tablet Take 50 mg by mouth daily. 06/27/20   [provider]    Allergies    Shrimp [shellfish allergy] and  Penicillins  Review of Systems   Review of Systems  Constitutional:  Negative for chills, diaphoresis and fever.  Respiratory:  Positive for shortness of breath.   Cardiovascular:  Positive for chest pain.  Gastrointestinal:  Negative for abdominal pain, diarrhea, nausea and vomiting.  Musculoskeletal:  Positive for arthralgias and back pain.  All other systems reviewed and are negative.  Physical Exam Updated Vital Signs BP 134/77 (BP Location: Left Arm)   Pulse 86   Temp 98.7 F (37.1 C)   Resp 14   Ht 5\' 8"  (1.727 m)   Wt 95.7 kg   SpO2 100%   BMI 32.08 kg/m   Physical Exam Vitals and nursing note reviewed.  Constitutional:      Appearance: She is not ill-appearing or diaphoretic.     Comments: Laying  on left side  HENT:     Head: Normocephalic and atraumatic.  Eyes:     Conjunctiva/sclera: Conjunctivae normal.  Cardiovascular:     Rate and Rhythm: Normal rate and regular rhythm.     Pulses:          Radial pulses are 2+ on the right side and 2+ on the left side.     Heart sounds: Normal heart sounds.  Pulmonary:     Effort: Pulmonary effort is normal.     Breath sounds: Normal breath sounds. No decreased breath sounds, wheezing, rhonchi or rales.  Chest:     Chest wall: No tenderness.  Abdominal:     Palpations: Abdomen is soft.     Tenderness: There is no abdominal tenderness. There is no guarding or rebound.  Musculoskeletal:     Cervical back: Neck supple.     Comments: No C, T, or L midline spinal TTP. + Right mid back pain along parathoracic musculature as well as TTP to R scapula. No significant TTP to the R shoulder joint. ROM intact to R shoulder. Strength and sensation equal to BUEs. 2+ radial pulses bilaterally. Negative Neer's and Hawkin's and Empty Can test.   Skin:    General: Skin is warm and dry.  Neurological:     Mental Status: She is alert.    ED Results / Procedures / Treatments   Labs (all labs ordered are listed, but only abnormal results are displayed) Labs Reviewed  CBC - Abnormal; Notable for the following components:      Result Value   RBC 3.82 (*)    Hemoglobin 11.8 (*)    HCT 34.8 (*)    All other components within normal limits  D-DIMER, QUANTITATIVE - Abnormal; Notable for the following components:   D-Dimer, Quant 1.20 (*)    All other components within normal limits  BASIC METABOLIC PANEL  HEPATIC FUNCTION PANEL  TROPONIN I (HIGH SENSITIVITY)  TROPONIN I (HIGH SENSITIVITY)    EKG None  Radiology DG Chest 2 View  Result Date: 02/02/2021 CLINICAL DATA:  Rt Scapula to RT upper back pain with and w/o palpation. No old or recent injury known. No actual chest complaints EXAM: CHEST - 2 VIEW COMPARISON:  07/23/2020 FINDINGS: Lungs are  clear. Heart size and mediastinal contours are within normal limits. No effusion. Visualized bones unremarkable. IMPRESSION: No acute cardiopulmonary disease. Electronically Signed   By: Corlis Leak  Hassell M.D.   On: 02/02/2021 13:45   DG Shoulder Right  Result Date: 02/02/2021 CLINICAL DATA:  Pain and tenderness without trauma EXAM: RIGHT SHOULDER - 2+ VIEW COMPARISON:  None available FINDINGS: Small spurs from the acromion and humeral  head. No fracture or dislocation. Normal mineralization and alignment. IMPRESSION: No acute findings.  Degenerative spurring as above. Electronically Signed   By: Corlis Leak M.D.   On: 02/02/2021 13:34   CT Angio Chest PE W/Cm &/Or Wo Cm  Result Date: 02/02/2021 CLINICAL DATA:  Right subscapular and breast pain X 3 weeks, positive D-dimer, low/intermediate prob PE EXAM: CT ANGIOGRAPHY CHEST WITH CONTRAST TECHNIQUE: Multidetector CT imaging of the chest was performed using the standard protocol during bolus administration of intravenous contrast. Multiplanar CT image reconstructions and MIPs were obtained to evaluate the vascular anatomy. CONTRAST:  OMNIPAQUE IOHEXOL 350 MG/ML SOLN COMPARISON:  None. FINDINGS: Cardiovascular: Heart size normal. No pericardial effusion. Satisfactory opacification of pulmonary arteries noted, and there is no evidence of pulmonary emboli. Adequate contrast opacification of the thoracic aorta with no evidence of dissection, aneurysm, or stenosis. There is bovine variant brachiocephalic arch anatomy without proximal stenosis. No significant atheromatous change. Mediastinum/Nodes: No mass or adenopathy. Lungs/Pleura: No pleural effusion. No pneumothorax. Lungs are clear. Upper Abdomen: No acute abnormality. Musculoskeletal: No chest wall abnormality. No acute or significant osseous findings. Review of the MIP images confirms the above findings. IMPRESSION: Negative.  No PE or thoracic aortic dissection. Electronically Signed   By: Corlis Leak M.D.    On: 02/02/2021 14:25    Procedures Procedures   Medications Ordered in ED Medications  acetaminophen (TYLENOL) tablet 1,000 mg (1,000 mg Oral Given 02/02/21 1332)  iohexol (OMNIPAQUE) 350 MG/ML injection 100 mL (100 mLs Intravenous Contrast Given 02/02/21 1348)    ED Course  I have reviewed the triage vital signs and the nursing notes.  Pertinent labs & imaging results that were available during my care of the patient were reviewed by me and considered in my medical decision making (see chart for details).    MDM Rules/Calculators/A&P                          55 year old female who presents to the ED today with atraumatic right mid back/scapular pain for the past 3 weeks.  Seen by PCP and told that she had a rotator cuff injury, treated with an IM injection.  Also had lab work done in reportedly tested positive for H. pylori, currently on antibiotics for same.  On arrival to the ED today patient is afebrile, nontachycardic.  She is noted to be tachypneic on arrival however this is dissipated while in the room, suspect secondary to pain.  She had work-up started while in the waiting room for chest pain as she reports that the pain in her back radiates around and into her chest at times making her feel short of breath.  She denies any history of DVT or PE and does not have any risk factors for same however unable to [facility] out due to age.  EKG with possible left atrial enlargement.  troponin less than 2.  CBC with a hemoglobin of 11.8, appears stable compared to previous.  BMP without electrolyte abnormalities.  Does not appear she has had a chest x-ray done however I have lower suspicion for ACS causing her symptoms at this time.  On my exam she is noted to have tenderness palpation along the right scapular joint.  She has no overlying skin changes to suggest herpes zoster.  She has good range of motion of her shoulder with negative Hawkins, empty can, Neer's.  Lower suspicion for rotator cuff injury  at this time.  Question shoulder  impingement versus tendinitis versus other etiology.  Patient did not have x-rays with PCP.  We will plan for x-ray of the shoulder/scapula as well as chest.  We will add on dimer at this time given complaint of shortness of breath and unable to [facility] out.  If negative she is moderate risk for Wells and I do not feel she requires additional CTA if D-dimer is negative.  Patient does question if her gallbladder could be causing pain and she has heard of this.  She has no right upper quadrant tenderness palpation, nausea, vomiting however will add on LFTs at this time as well.  CXR clear Shoulder xray: FINDINGS:  Small spurs from the acromion and humeral head. No fracture or  dislocation. Normal mineralization and alignment.     IMPRESSION:  No acute findings.  Degenerative spurring as above.      D dimer has returned elevated at 1.20; unable to age adjust. Will obtain CTA at this time.   CTA negative for PE or other abnormality Suspect spurs are causing pain. Will provide shoulder sling at this time and sports medicine follow up. Will provide short course of pain medication to take as needed. Pt in agreement with plan and stable for discharge home.   This note was prepared using Dragon voice recognition software and may include unintentional dictation errors due to the inherent limitations of voice recognition software.    Final Clinical Impression(s) / ED Diagnoses Final diagnoses:  Pain of right scapula  Bone spur of acromioclavicular joint, right    Rx / DC Orders ED Discharge Orders          Ordered    oxyCODONE-acetaminophen (PERCOCET/ROXICET) 5-325 MG tablet  Every 6 hours PRN        02/02/21 1438             Discharge Instructions      Please follow up with Dr. Jordan Likes sports medicine for further evaluation of your scapular pain/spurs seen on xray.  Use shoulder sling as needed. You can continue taking 800 mg Ibuprofen every 8 hours as  needed for pain. I have prescribed a short course of narcotic pain medication to take for break through pain.   Return to the ED for any new/worsening symptoms       Tanda Rockers, Cordelia Poche 02/02/21 1439    Alvira Monday, MD 02/03/21 2210

## 2021-02-02 NOTE — ED Notes (Signed)
Pt discharged to home. Discharge instructions have been discussed with patient and/or family members. Pt verbally acknowledges understanding d/c instructions, and endorses comprehension to checkout at registration before leaving.  °

## 2021-02-09 ENCOUNTER — Ambulatory Visit: Payer: Self-pay

## 2021-02-09 ENCOUNTER — Other Ambulatory Visit: Payer: Self-pay

## 2021-02-09 ENCOUNTER — Ambulatory Visit: Payer: BC Managed Care – PPO | Admitting: Family Medicine

## 2021-02-09 VITALS — BP 122/78 | HR 89 | Ht 68.0 in | Wt 210.0 lb

## 2021-02-09 DIAGNOSIS — M25511 Pain in right shoulder: Secondary | ICD-10-CM | POA: Diagnosis not present

## 2021-02-09 DIAGNOSIS — M5412 Radiculopathy, cervical region: Secondary | ICD-10-CM | POA: Diagnosis not present

## 2021-02-09 MED ORDER — METHYLPREDNISOLONE ACETATE 40 MG/ML IJ SUSP
40.0000 mg | Freq: Once | INTRAMUSCULAR | Status: AC
  Administered 2021-02-09: 40 mg via INTRAMUSCULAR

## 2021-02-09 NOTE — Progress Notes (Signed)
  Sherry Russell - 55 y.o. female MRN [id number]  Date of birth: 1966-02-19  SUBJECTIVE:  Including CC & ROS.  Chief Complaint  Patient presents with   New Patient (Initial Visit)    Upper back pain radiating to chest X 3 weeks, Patient was given medications to help at night to sleep and the diclofenac topical, patient states the sling and rest is what helps the most.    Sherry Russell is a 55 y.o. female that is presenting with right periscapular and chest pain.  Pain is acutely occurring.  Has gotten worse over the past few weeks.  Denies any injury or inciting event..  Independent review of the right shoulder x-ray from 7/19 shows no acute findings.   Review of Systems See HPI   HISTORY: Past Medical, Surgical, Social, and Family History Reviewed & Updated per EMR.   Pertinent Historical Findings include:  Past Medical History:  Diagnosis Date   Anxiety    Arrhythmia    Back injury    Depression    Hypoglycemia     Past Surgical History:  Procedure Laterality Date   ABDOMINAL HYSTERECTOMY     BTL     COLECTOMY      No family history on file.  Social History   Socioeconomic History   Marital status: Single    Spouse name: Not on file   Number of children: Not on file   Years of education: Not on file   Highest education level: Not on file  Occupational History   Not on file  Tobacco Use   Smoking status: Never   Smokeless tobacco: Never  Substance and Sexual Activity   Alcohol use: Not Currently    Comment: social   Drug use: No   Sexual activity: Not on file  Other Topics Concern   Not on file  Social History Narrative   Not on file   Social Determinants of Health   Financial Resource Strain: Not on file  Food Insecurity: Not on file  Transportation Needs: Not on file  Physical Activity: Not on file  Stress: Not on file  Social Connections: Not on file  Intimate Partner Violence: Not on file     PHYSICAL EXAM:  VS: BP 122/78   Pulse 89    Ht 5\' 8"  (1.727 m)   Wt 210 lb (95.3 kg)   SpO2 98%   BMI 31.93 kg/m  Physical Exam Gen: NAD, alert, cooperative with exam, well-appearing MSK:  Right shoulder: Normal range of motion. Normal strength resistance. No swelling or ecchymosis. Neurovascularly intact  Limited ultrasound: Right shoulder:  Normal-appearing biceps tendon. No change of the insertion of the pectoralis major. No changes of the pectoralis muscle itself. Normal-appearing subscapularis. Degenerative changes appreciated of the supraspinatus. No change in the posterior glenohumeral joint.  Summary: No significant structural change appreciated  Ultrasound and interpretation by , MD    ASSESSMENT & PLAN:   Cervical radiculopathy Symptoms seem more consistent with a radicular type pain.  Exam is not indicative of the shoulder joint itself.  Seems less likely for costochondritis or pleurisy. -Counseled on home exercise therapy and supportive care. -IM Depo-Medrol. -Could consider physical therapy or nerve study.

## 2021-02-09 NOTE — Patient Instructions (Signed)
Nice to meet you Please try heat  Please use the sling for comfort  Please try the exercises   Please send me a message in MyChart with any questions or updates.  Please see me back in 2 weeks.   --Dr. Jordan Likes

## 2021-02-10 DIAGNOSIS — M5412 Radiculopathy, cervical region: Secondary | ICD-10-CM | POA: Insufficient documentation

## 2021-02-10 NOTE — Assessment & Plan Note (Signed)
Symptoms seem more consistent with a radicular type pain.  Exam is not indicative of the shoulder joint itself.  Seems less likely for costochondritis or pleurisy. -Counseled on home exercise therapy and supportive care. -IM Depo-Medrol. -Could consider physical therapy or nerve study.

## 2021-02-12 DIAGNOSIS — U071 COVID-19: Secondary | ICD-10-CM | POA: Diagnosis not present

## 2021-02-12 DIAGNOSIS — R059 Cough, unspecified: Secondary | ICD-10-CM | POA: Diagnosis not present

## 2021-02-12 DIAGNOSIS — J029 Acute pharyngitis, unspecified: Secondary | ICD-10-CM | POA: Diagnosis not present

## 2021-02-12 DIAGNOSIS — R0981 Nasal congestion: Secondary | ICD-10-CM | POA: Diagnosis not present

## 2021-02-12 DIAGNOSIS — R0982 Postnasal drip: Secondary | ICD-10-CM | POA: Diagnosis not present

## 2021-02-12 DIAGNOSIS — B349 Viral infection, unspecified: Secondary | ICD-10-CM | POA: Diagnosis not present

## 2021-02-18 DIAGNOSIS — K5900 Constipation, unspecified: Secondary | ICD-10-CM | POA: Diagnosis not present

## 2021-02-18 DIAGNOSIS — K219 Gastro-esophageal reflux disease without esophagitis: Secondary | ICD-10-CM | POA: Diagnosis not present

## 2021-02-18 DIAGNOSIS — U071 COVID-19: Secondary | ICD-10-CM | POA: Diagnosis not present

## 2021-02-18 DIAGNOSIS — M7541 Impingement syndrome of right shoulder: Secondary | ICD-10-CM | POA: Diagnosis not present

## 2021-02-23 ENCOUNTER — Ambulatory Visit: Payer: BC Managed Care – PPO | Admitting: Family Medicine

## 2021-03-04 DIAGNOSIS — Z23 Encounter for immunization: Secondary | ICD-10-CM | POA: Diagnosis not present

## 2021-03-04 DIAGNOSIS — G4700 Insomnia, unspecified: Secondary | ICD-10-CM | POA: Diagnosis not present

## 2021-03-04 DIAGNOSIS — Z7185 Encounter for immunization safety counseling: Secondary | ICD-10-CM | POA: Diagnosis not present

## 2021-03-04 DIAGNOSIS — F331 Major depressive disorder, recurrent, moderate: Secondary | ICD-10-CM | POA: Diagnosis not present

## 2021-03-04 DIAGNOSIS — F411 Generalized anxiety disorder: Secondary | ICD-10-CM | POA: Diagnosis not present

## 2021-03-12 DIAGNOSIS — E669 Obesity, unspecified: Secondary | ICD-10-CM | POA: Diagnosis not present

## 2021-03-12 DIAGNOSIS — G4700 Insomnia, unspecified: Secondary | ICD-10-CM | POA: Diagnosis not present

## 2021-03-12 DIAGNOSIS — Z713 Dietary counseling and surveillance: Secondary | ICD-10-CM | POA: Diagnosis not present

## 2021-04-05 DIAGNOSIS — G4700 Insomnia, unspecified: Secondary | ICD-10-CM | POA: Diagnosis not present

## 2021-04-05 DIAGNOSIS — E782 Mixed hyperlipidemia: Secondary | ICD-10-CM | POA: Diagnosis not present

## 2021-04-05 DIAGNOSIS — R7303 Prediabetes: Secondary | ICD-10-CM | POA: Diagnosis not present

## 2021-04-05 DIAGNOSIS — E669 Obesity, unspecified: Secondary | ICD-10-CM | POA: Diagnosis not present

## 2021-05-03 DIAGNOSIS — M5418 Radiculopathy, sacral and sacrococcygeal region: Secondary | ICD-10-CM | POA: Diagnosis not present

## 2021-05-03 DIAGNOSIS — R7303 Prediabetes: Secondary | ICD-10-CM | POA: Diagnosis not present

## 2021-05-03 DIAGNOSIS — E782 Mixed hyperlipidemia: Secondary | ICD-10-CM | POA: Diagnosis not present

## 2021-05-03 DIAGNOSIS — M7918 Myalgia, other site: Secondary | ICD-10-CM | POA: Diagnosis not present

## 2021-05-05 DIAGNOSIS — E669 Obesity, unspecified: Secondary | ICD-10-CM | POA: Diagnosis not present

## 2021-05-05 DIAGNOSIS — E782 Mixed hyperlipidemia: Secondary | ICD-10-CM | POA: Diagnosis not present

## 2021-05-05 DIAGNOSIS — R7303 Prediabetes: Secondary | ICD-10-CM | POA: Diagnosis not present

## 2021-06-02 DIAGNOSIS — Z23 Encounter for immunization: Secondary | ICD-10-CM | POA: Diagnosis not present

## 2021-06-08 DIAGNOSIS — K635 Polyp of colon: Secondary | ICD-10-CM | POA: Diagnosis not present

## 2021-06-08 DIAGNOSIS — Z86010 Personal history of colonic polyps: Secondary | ICD-10-CM | POA: Diagnosis not present

## 2021-06-08 DIAGNOSIS — Z1211 Encounter for screening for malignant neoplasm of colon: Secondary | ICD-10-CM | POA: Diagnosis not present

## 2021-06-08 DIAGNOSIS — Z980 Intestinal bypass and anastomosis status: Secondary | ICD-10-CM | POA: Diagnosis not present

## 2021-06-08 DIAGNOSIS — Z9049 Acquired absence of other specified parts of digestive tract: Secondary | ICD-10-CM | POA: Diagnosis not present

## 2021-06-08 DIAGNOSIS — D124 Benign neoplasm of descending colon: Secondary | ICD-10-CM | POA: Diagnosis not present

## 2021-06-08 DIAGNOSIS — Z85038 Personal history of other malignant neoplasm of large intestine: Secondary | ICD-10-CM | POA: Diagnosis not present

## 2021-08-16 DIAGNOSIS — E785 Hyperlipidemia, unspecified: Secondary | ICD-10-CM | POA: Diagnosis not present

## 2021-08-16 DIAGNOSIS — D649 Anemia, unspecified: Secondary | ICD-10-CM | POA: Diagnosis not present

## 2021-08-16 DIAGNOSIS — E559 Vitamin D deficiency, unspecified: Secondary | ICD-10-CM | POA: Diagnosis not present

## 2021-08-16 DIAGNOSIS — R7303 Prediabetes: Secondary | ICD-10-CM | POA: Diagnosis not present

## 2021-08-27 DIAGNOSIS — R7303 Prediabetes: Secondary | ICD-10-CM | POA: Diagnosis not present

## 2021-08-27 DIAGNOSIS — E559 Vitamin D deficiency, unspecified: Secondary | ICD-10-CM | POA: Diagnosis not present

## 2021-08-27 DIAGNOSIS — E669 Obesity, unspecified: Secondary | ICD-10-CM | POA: Diagnosis not present

## 2021-08-27 DIAGNOSIS — E782 Mixed hyperlipidemia: Secondary | ICD-10-CM | POA: Diagnosis not present

## 2021-12-07 DIAGNOSIS — Z0000 Encounter for general adult medical examination without abnormal findings: Secondary | ICD-10-CM | POA: Diagnosis not present

## 2021-12-07 DIAGNOSIS — E559 Vitamin D deficiency, unspecified: Secondary | ICD-10-CM | POA: Diagnosis not present

## 2021-12-07 DIAGNOSIS — Z114 Encounter for screening for human immunodeficiency virus [HIV]: Secondary | ICD-10-CM | POA: Diagnosis not present

## 2021-12-09 DIAGNOSIS — E782 Mixed hyperlipidemia: Secondary | ICD-10-CM | POA: Diagnosis not present

## 2021-12-09 DIAGNOSIS — E559 Vitamin D deficiency, unspecified: Secondary | ICD-10-CM | POA: Diagnosis not present

## 2021-12-09 DIAGNOSIS — E669 Obesity, unspecified: Secondary | ICD-10-CM | POA: Diagnosis not present

## 2022-03-04 DIAGNOSIS — B349 Viral infection, unspecified: Secondary | ICD-10-CM | POA: Diagnosis not present

## 2022-03-13 IMAGING — CT CT ANGIO CHEST
2 of 8 series · 19 of 36 positions shown · IV contrast (omnipaque)
Comparison: None.

CLINICAL DATA: Right subscapular and breast pain X 3 weeks,
positive D-dimer, low/intermediate prob PE

EXAM:
CT ANGIOGRAPHY CHEST WITH CONTRAST
TECHNIQUE: Multidetector CT imaging of the chest was performed using the
standard protocol during bolus administration of intravenous
contrast. Multiplanar CT image reconstructions and MIPs were
obtained to evaluate the vascular anatomy.
CONTRAST:  100mL OMNIPAQUE IOHEXOL 350 MG/ML SOLN

[Series 6: pe thins · axial · 0.62mm/px · z∈[-248,-7]mm · 18 of 271 slices shown]
[im 15/271  lung]
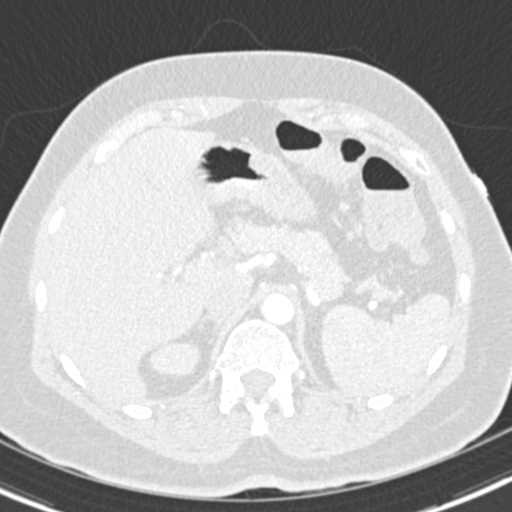
[im 29/271  mediastinal]
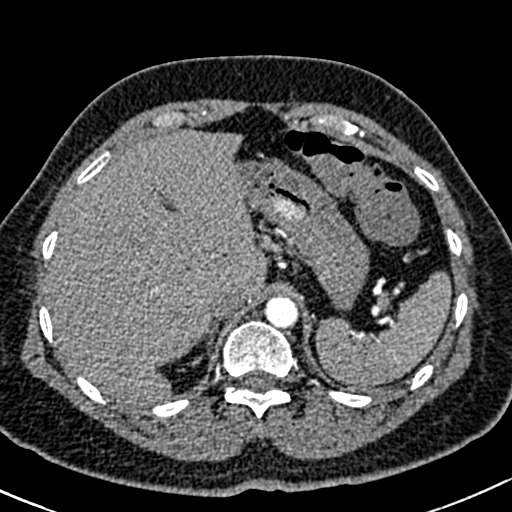
[im 43/271  lung]
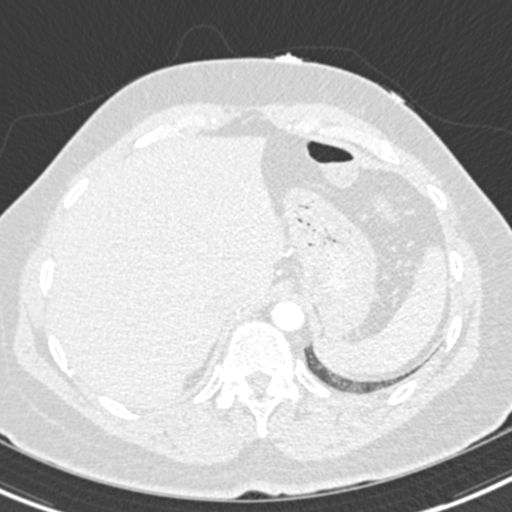
[im 57/271  mediastinal]
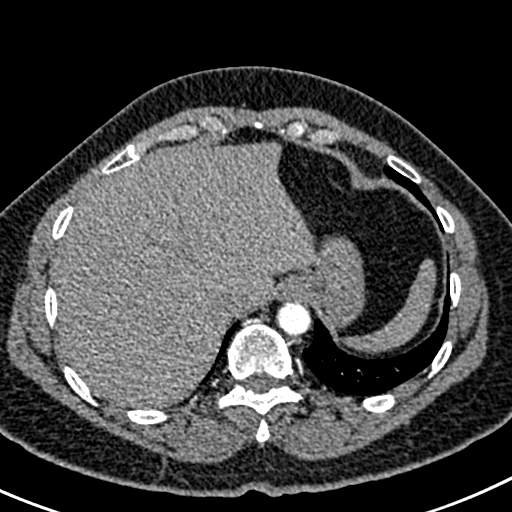
[im 72/271  lung]
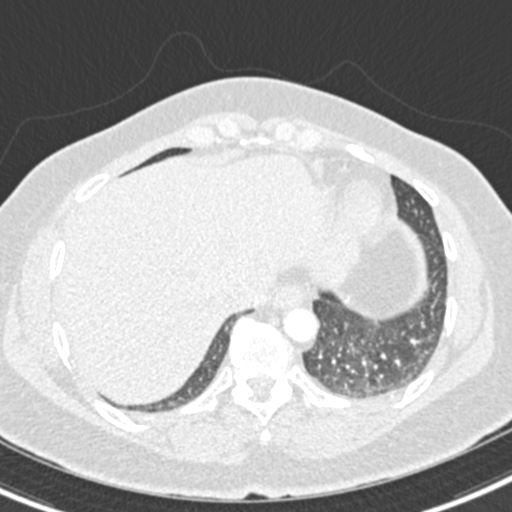
[im 86/271  mediastinal]
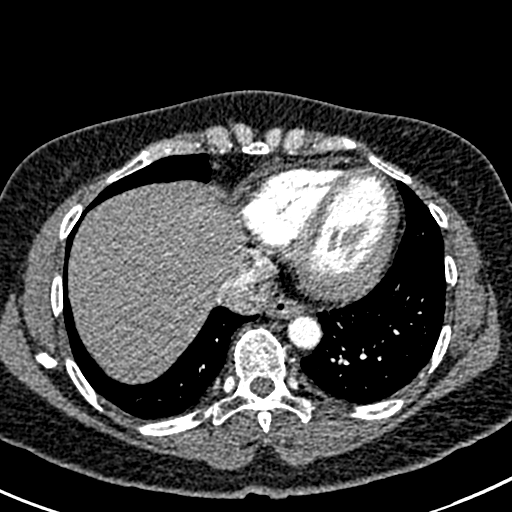
[im 100/271  lung]
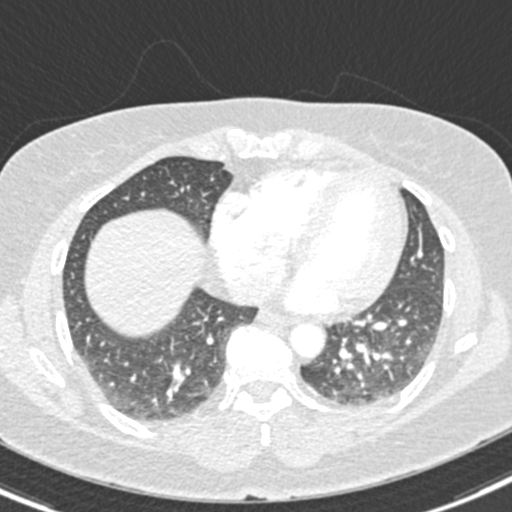
[im 114/271  mediastinal]
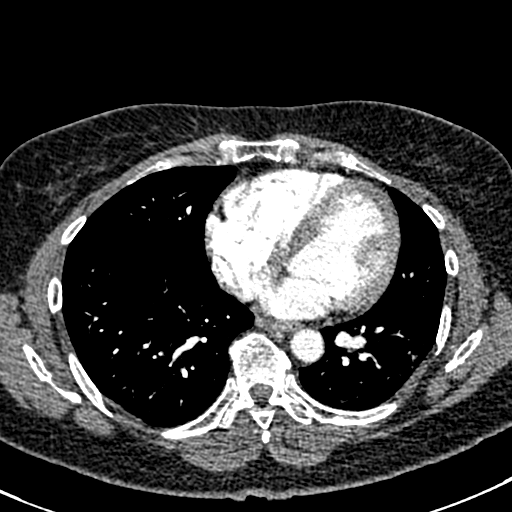
[im 128/271  lung]
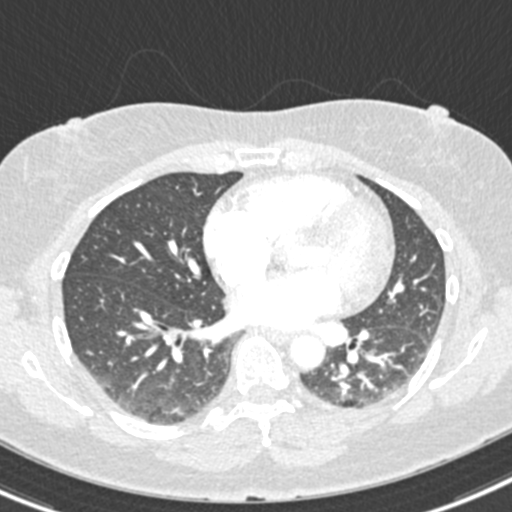
[im 143/271  mediastinal]
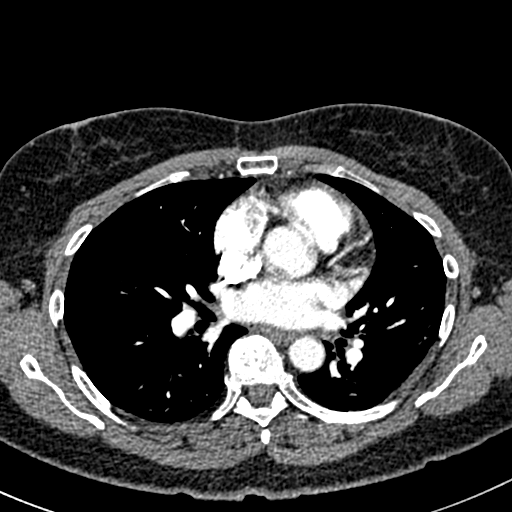
[im 157/271  lung]
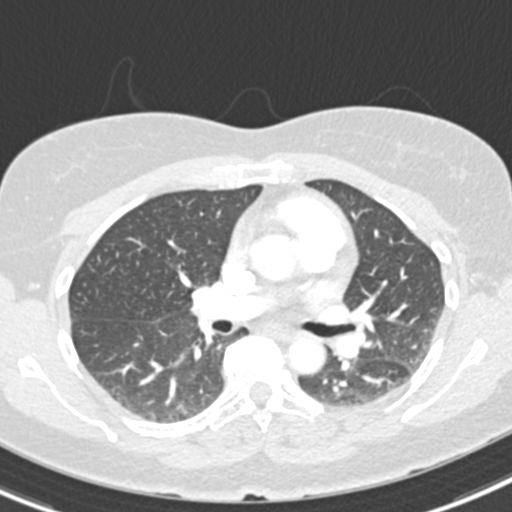
[im 171/271  mediastinal]
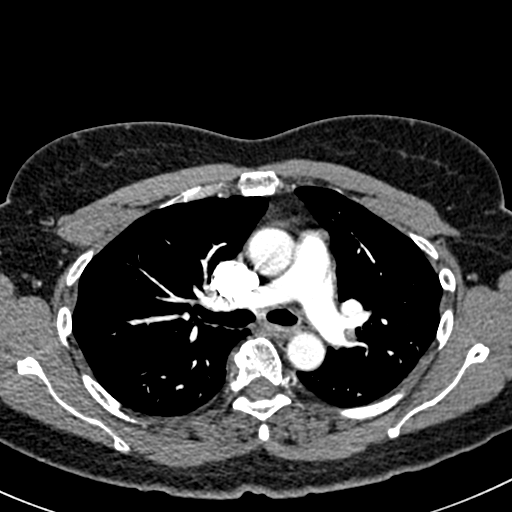
[im 185/271  lung]
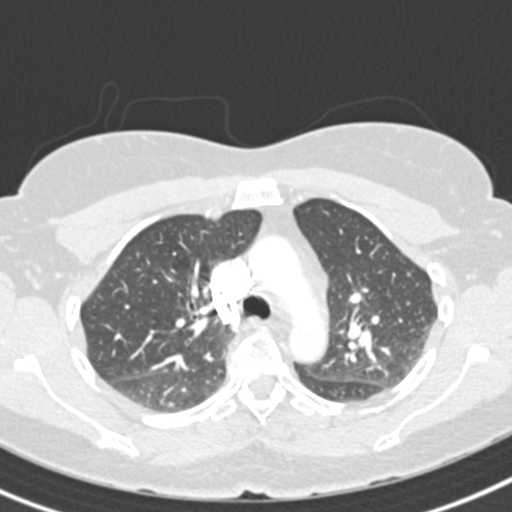
[im 199/271  mediastinal]
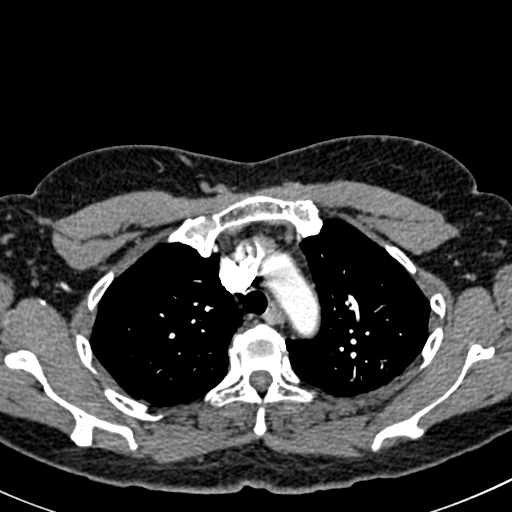
[im 214/271  lung]
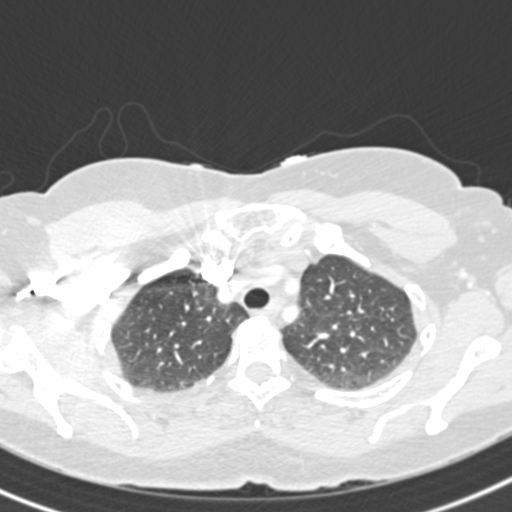
[im 228/271  mediastinal]
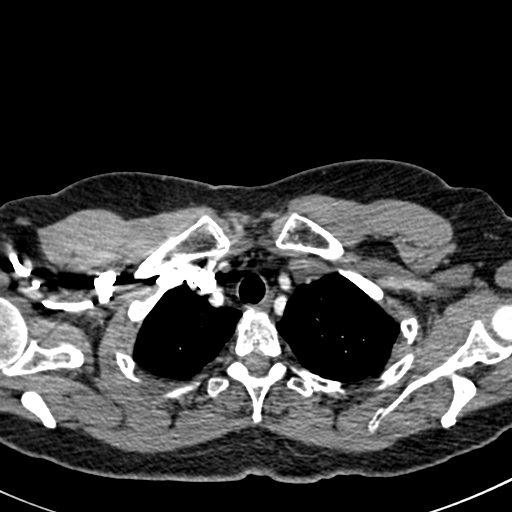
[im 242/271  lung]
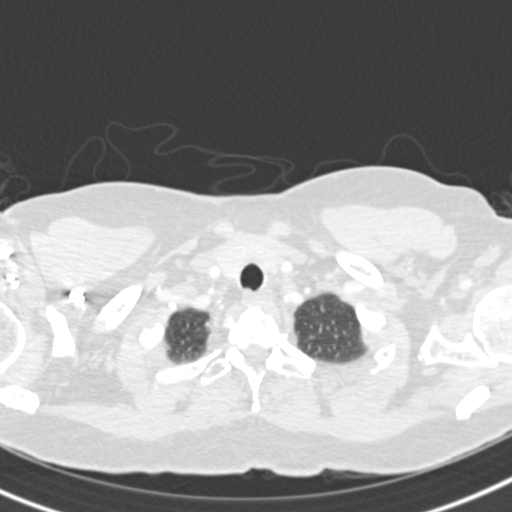
[im 256/271  mediastinal]
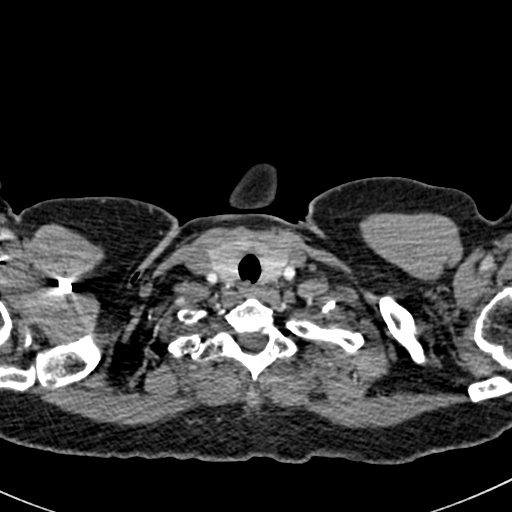

[Series 7: pe coronal mpr · coronal · 0.56mm/px · 1 of 113 slices shown]
[im 57/113  mediastinal]
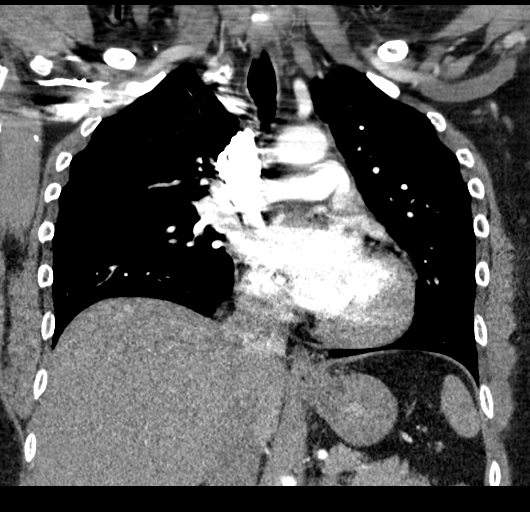

[19 of 36 positions shown; findings below may reference images not displayed]

FINDINGS: Cardiovascular: Heart size normal. No pericardial effusion.
Satisfactory opacification of pulmonary arteries noted, and there is
no evidence of pulmonary emboli. Adequate contrast opacification of
the thoracic aorta with no evidence of dissection, aneurysm, or
stenosis. There is bovine variant brachiocephalic arch anatomy
without proximal stenosis. No significant atheromatous change.

Mediastinum/Nodes: No mass or adenopathy.

Lungs/Pleura: No pleural effusion. No pneumothorax. Lungs are clear.

Upper Abdomen: No acute abnormality.

Musculoskeletal: No chest wall abnormality. No acute or significant
osseous findings.

Review of the MIP images confirms the above findings.
IMPRESSION: Negative.  No PE or thoracic aortic dissection.

## 2022-03-18 DIAGNOSIS — F411 Generalized anxiety disorder: Secondary | ICD-10-CM | POA: Diagnosis not present

## 2022-03-18 DIAGNOSIS — R7303 Prediabetes: Secondary | ICD-10-CM | POA: Diagnosis not present

## 2022-03-18 DIAGNOSIS — E669 Obesity, unspecified: Secondary | ICD-10-CM | POA: Diagnosis not present

## 2022-04-26 DIAGNOSIS — R051 Acute cough: Secondary | ICD-10-CM | POA: Diagnosis not present

## 2022-04-27 DIAGNOSIS — J069 Acute upper respiratory infection, unspecified: Secondary | ICD-10-CM | POA: Diagnosis not present

## 2022-04-27 DIAGNOSIS — B9689 Other specified bacterial agents as the cause of diseases classified elsewhere: Secondary | ICD-10-CM | POA: Diagnosis not present

## 2022-04-27 DIAGNOSIS — J329 Chronic sinusitis, unspecified: Secondary | ICD-10-CM | POA: Diagnosis not present

## 2022-05-05 DIAGNOSIS — J040 Acute laryngitis: Secondary | ICD-10-CM | POA: Diagnosis not present

## 2022-05-05 DIAGNOSIS — J069 Acute upper respiratory infection, unspecified: Secondary | ICD-10-CM | POA: Diagnosis not present

## 2022-06-13 DIAGNOSIS — E669 Obesity, unspecified: Secondary | ICD-10-CM | POA: Diagnosis not present

## 2022-06-28 DIAGNOSIS — F32A Depression, unspecified: Secondary | ICD-10-CM | POA: Diagnosis not present

## 2022-06-28 DIAGNOSIS — N3001 Acute cystitis with hematuria: Secondary | ICD-10-CM | POA: Diagnosis not present

## 2022-06-28 DIAGNOSIS — Z01419 Encounter for gynecological examination (general) (routine) without abnormal findings: Secondary | ICD-10-CM | POA: Diagnosis not present

## 2022-06-28 DIAGNOSIS — Z23 Encounter for immunization: Secondary | ICD-10-CM | POA: Diagnosis not present

## 2022-07-29 DIAGNOSIS — R7303 Prediabetes: Secondary | ICD-10-CM | POA: Diagnosis not present

## 2022-07-29 DIAGNOSIS — E669 Obesity, unspecified: Secondary | ICD-10-CM | POA: Diagnosis not present

## 2022-07-29 DIAGNOSIS — E785 Hyperlipidemia, unspecified: Secondary | ICD-10-CM | POA: Diagnosis not present

## 2022-08-26 DIAGNOSIS — R0981 Nasal congestion: Secondary | ICD-10-CM | POA: Diagnosis not present

## 2022-08-26 DIAGNOSIS — J069 Acute upper respiratory infection, unspecified: Secondary | ICD-10-CM | POA: Diagnosis not present

## 2022-08-26 DIAGNOSIS — E669 Obesity, unspecified: Secondary | ICD-10-CM | POA: Diagnosis not present

## 2022-09-15 ENCOUNTER — Emergency Department (HOSPITAL_COMMUNITY)
Admission: EM | Admit: 2022-09-15 | Discharge: 2022-09-15 | Disposition: A | Discharge status: Home / Self Care | Payer: 59 | Attending: Emergency Medicine | Admitting: Emergency Medicine

## 2022-09-15 ENCOUNTER — Emergency Department (HOSPITAL_COMMUNITY): Payer: 59

## 2022-09-15 ENCOUNTER — Other Ambulatory Visit: Payer: Self-pay

## 2022-09-15 ENCOUNTER — Encounter (HOSPITAL_COMMUNITY): Payer: Self-pay

## 2022-09-15 DIAGNOSIS — R091 Pleurisy: Secondary | ICD-10-CM | POA: Diagnosis not present

## 2022-09-15 DIAGNOSIS — Z7982 Long term (current) use of aspirin: Secondary | ICD-10-CM | POA: Diagnosis not present

## 2022-09-15 DIAGNOSIS — R0789 Other chest pain: Secondary | ICD-10-CM | POA: Diagnosis not present

## 2022-09-15 DIAGNOSIS — R791 Abnormal coagulation profile: Secondary | ICD-10-CM | POA: Insufficient documentation

## 2022-09-15 DIAGNOSIS — Z1152 Encounter for screening for COVID-19: Secondary | ICD-10-CM | POA: Insufficient documentation

## 2022-09-15 DIAGNOSIS — R079 Chest pain, unspecified: Secondary | ICD-10-CM | POA: Diagnosis not present

## 2022-09-15 LAB — RESP PANEL BY RT-PCR (RSV, FLU A&B, COVID)  RVPGX2
Influenza A by PCR: NEGATIVE
Influenza B by PCR: NEGATIVE
Resp Syncytial Virus by PCR: NEGATIVE
SARS Coronavirus 2 by RT PCR: NEGATIVE

## 2022-09-15 LAB — BASIC METABOLIC PANEL
Anion gap: 7 (ref 5–15)
BUN: 11 mg/dL (ref 6–20)
CO2: 27 mmol/L (ref 22–32)
Calcium: 8.8 mg/dL — ABNORMAL LOW (ref 8.9–10.3)
Chloride: 105 mmol/L (ref 98–111)
Creatinine, Ser: 0.79 mg/dL (ref 0.44–1.00)
GFR, Estimated: >60 mL/min (ref >60)
Glucose, Bld: 97 mg/dL (ref 70–99)
Potassium: 3.7 mmol/L (ref 3.5–5.1)
Sodium: 139 mmol/L (ref 135–145)

## 2022-09-15 LAB — CBC
HCT: 34.9 % — ABNORMAL LOW (ref 36.0–46.0)
Hemoglobin: 11.8 g/dL — ABNORMAL LOW (ref 12.0–15.0)
MCH: 31.1 pg (ref 26.0–34.0)
MCHC: 33.8 g/dL (ref 30.0–36.0)
MCV: 91.8 fL (ref 80.0–100.0)
Platelets: 295 10*3/uL (ref 150–400)
RBC: 3.8 MIL/uL — ABNORMAL LOW (ref 3.87–5.11)
RDW: 12.5 % (ref 11.5–15.5)
WBC: 5.3 10*3/uL (ref 4.0–10.5)
nRBC: 0 % (ref 0.0–0.2)

## 2022-09-15 LAB — TROPONIN I (HIGH SENSITIVITY)
Troponin I (High Sensitivity): <2 ng/L (ref <18)
Troponin I (High Sensitivity): <2 ng/L (ref <18)

## 2022-09-15 LAB — D-DIMER, QUANTITATIVE: D-Dimer, Quant: 0.57 ug/mL-FEU — ABNORMAL HIGH (ref 0.00–0.50)

## 2022-09-15 LAB — I-STAT BETA HCG BLOOD, ED (MC, WL, AP ONLY): I-stat hCG, quantitative: <5 m[IU]/mL (ref <5)

## 2022-09-15 MED ORDER — BENZONATATE 100 MG PO CAPS: 100.0000 mg | ORAL_CAPSULE | Freq: Three times a day (TID) | ORAL | 0 refills | Status: AC

## 2022-09-15 MED ORDER — NAPROXEN 500 MG PO TABS: 500.0000 mg | ORAL_TABLET | Freq: Two times a day (BID) | ORAL | 0 refills | Status: AC

## 2022-09-15 NOTE — Discharge Instructions (Addendum)
Take medications to help with the pain and discomfort.  Follow-up with your primary care doctor to be rechecked.  Return as needed for worsening symptoms.

## 2022-09-15 NOTE — ED Provider Notes (Signed)
[place] EMERGENCY DEPARTMENT AT [facility] Provider Note   CSN: [id number] Arrival date & time: 09/15/22  [id number]     History  Chief Complaint  Patient presents with   Chest Pain    Sherry Russell is a 57 y.o. female.   Chest Pain    Patient has a history of hypoglycemia dysrhythmias, anxiety, depression presents to the ED with complaints of chest pain.  Patient states she has had issues with URI type symptoms for a few weeks.  Patient states over the last 2 days she started having pressure squeezing pain on the right side of her chest.  It increases when she takes a deep breath.  Makes her feel short of breath.  She has not noticed any leg swelling.  No fevers.Patient denies any known history of coronary artery disease.  No history of PE or DVT  Home Medications Prior to Admission medications   Medication Sig Start Date End Date Taking? Authorizing Provider  benzonatate (TESSALON) 100 MG capsule Take 1 capsule (100 mg total) by mouth every 8 (eight) hours. 09/15/22  Yes Dorie Rank, MD  naproxen (NAPROSYN) 500 MG tablet Take 1 tablet (500 mg total) by mouth 2 (two) times daily. 09/15/22  Yes Dorie Rank, MD  aspirin EC 81 MG tablet Take 81 mg by mouth every 4 (four) hours as needed for mild pain or moderate pain.    [provider]  buPROPion (WELLBUTRIN XL) 150 MG 24 hr tablet Take 150 mg by mouth every morning. 12/31/20   [provider]  diclofenac (VOLTAREN) 50 MG EC tablet Take 50 mg by mouth 2 (two) times daily. 02/01/21   [provider]  diclofenac Sodium (VOLTAREN) 1 % GEL Apply 2 g topically 4 (four) times daily. 01/20/21   [provider]  estradiol (ESTRACE) 1 MG tablet estradiol 1 mg tablet  TAKE 1 TABLET(S) EVERY DAY BY ORAL ROUTE.    [provider]  HYDROcodone-acetaminophen (NORCO/VICODIN) 5-325 MG tablet Take 1-2 tablets by mouth every 6 (six) hours as needed for moderate pain or severe pain. Patient not taking:  Reported on 02/09/2021 03/09/16   Molpus, Jenny Reichmann, MD  meloxicam (MOBIC) 15 MG tablet Take 1 tablet (15 mg total) by mouth daily. 03/09/16   Molpus, John, MD  oxyCODONE-acetaminophen (PERCOCET/ROXICET) 5-325 MG tablet Take 1 tablet by mouth every 6 (six) hours as needed for severe pain. 02/02/21   Alroy Bailiff, Margaux, PA-C  sertraline (ZOLOFT) 50 MG tablet Take 50 mg by mouth daily. 06/27/20   [provider]      Allergies    Shrimp [shellfish allergy] and Penicillins    Review of Systems   Review of Systems  Cardiovascular:  Positive for chest pain.    Physical Exam Updated Vital Signs BP (!) 154/80   Pulse 68   Temp 97.6 F (36.4 C) (Oral)   Resp (!) 27   Ht 1.74 m (5' 8.5")   Wt 92.5 kg   SpO2 100%   BMI 30.57 kg/m  Physical Exam Vitals and nursing note reviewed.  Constitutional:      General: She is not in acute distress.    Appearance: She is well-developed. She is not diaphoretic.  HENT:     Head: Normocephalic and atraumatic.     Right Ear: External ear normal.     Left Ear: External ear normal.  Eyes:     General: No scleral icterus.       Right eye: No discharge.  Left eye: No discharge.     Conjunctiva/sclera: Conjunctivae normal.  Neck:     Trachea: No tracheal deviation.  Cardiovascular:     Rate and Rhythm: Normal rate and regular rhythm.  Pulmonary:     Effort: Pulmonary effort is normal. No respiratory distress.     Breath sounds: Normal breath sounds. No stridor. No wheezing or rales.  Abdominal:     General: Bowel sounds are normal. There is no distension.     Palpations: Abdomen is soft.     Tenderness: There is no abdominal tenderness. There is no guarding or rebound.  Musculoskeletal:        General: No tenderness or deformity.     Cervical back: Neck supple.  Skin:    General: Skin is warm and dry.     Findings: No rash.  Neurological:     General: No focal deficit present.     Mental Status: She is alert.     Cranial Nerves: No  cranial nerve deficit, dysarthria or facial asymmetry.     Sensory: No sensory deficit.     Motor: No abnormal muscle tone or seizure activity.     Coordination: Coordination normal.  Psychiatric:        Mood and Affect: Mood normal.     ED Results / Procedures / Treatments   Labs (all labs ordered are listed, but only abnormal results are displayed) Labs Reviewed  BASIC METABOLIC PANEL - Abnormal; Notable for the following components:      Result Value   Calcium 8.8 (*)    All other components within normal limits  CBC - Abnormal; Notable for the following components:   RBC 3.80 (*)    Hemoglobin 11.8 (*)    HCT 34.9 (*)    All other components within normal limits  D-DIMER, QUANTITATIVE - Abnormal; Notable for the following components:   D-Dimer, Quant 0.57 (*)    All other components within normal limits  RESP PANEL BY RT-PCR (RSV, FLU A&B, COVID)  [id number]  I-STAT BETA HCG BLOOD, ED ([facility], [facility], [facility] ONLY)  TROPONIN I (HIGH SENSITIVITY)  TROPONIN I (HIGH SENSITIVITY)    EKG EKG Interpretation  Date/Time:  Thursday September 15 2022 08:51:55 EST Ventricular Rate:  83 PR Interval:  167 QRS Duration: 80 QT Interval:  379 QTC Calculation: 446 R Axis:   76 Text Interpretation: Sinus rhythm No significant change since last tracing Confirmed by Dorie Rank [phone]) on 09/15/2022 9:23:09 AM  Radiology DG Chest 2 View  Result Date: 09/15/2022 CLINICAL DATA:  Chest pain for 2 days EXAM: CHEST - 2 VIEW COMPARISON:  02/01/2021 FINDINGS: Cardiomediastinal silhouette and pulmonary vasculature are within normal limits. Lungs are clear. IMPRESSION: No acute cardiopulmonary process. Electronically Signed   By: Miachel Roux M.D.   On: 09/15/2022 09:23    Procedures Procedures    Medications Ordered in ED Medications - No data to display  ED Course/ Medical Decision Making/ A&P Clinical Course as of 09/16/22 0738  Thu Sep 15, 2022  0926 CXR without acute  [JK]  1055 D-dimer,  quantitative(!) D-dimer slightly elevated at .57 but doubt clinically significant considering age adjusted cut offs [JK]  1056 Resp panel by RT-PCR (RSV, Flu A&B, Covid) Anterior Nasal Swab RSV COVID flu negative [JK]  [id number] Basic metabolic panel(!) Metabolic panel normal [JK]    Clinical Course User Index [JK] Dorie Rank, MD  Medical Decision Making Differential diagnosis includes but not limited to ACS, PE, pneumonia, pneumothorax, pleurisy  Problems Addressed: Chest pain, unspecified type: acute illness or injury that poses a threat to life or bodily functions Pleurisy: acute illness or injury that poses a threat to life or bodily functions  Amount and/or Complexity of Data Reviewed Labs: ordered. Decision-making details documented in ED Course. Radiology: ordered and independent interpretation performed.  Risk Prescription drug management.   Presented to the ED for evaluation of chest pain.  Recently associated with URI type symptoms.  No fevers or chills.  No leg swelling no dyspnea.  Symptoms atypical for acute coronary syndrome.  Serial troponins normal.  Doubt ACS.  Patient did have a pleuritic component to her chest pain but not having any shortness of breath.  D-dimer is slightly above baseline at .57 but would be within.  Low suspicion for PE and do not feel advanced imaging is necessary.  No evidence of pneumonia on x-ray.  Viral panel is negative.  Suspect pleuritic chest pain related to her cough and respiratory illness,  Evaluation and diagnostic testing in the emergency department does not suggest an emergent condition requiring admission or immediate intervention beyond what has been performed at this time.  The patient is safe for discharge and has been instructed to return immediately for worsening symptoms, change in symptoms or any other concerns.        Final Clinical Impression(s) / ED Diagnoses Final diagnoses:  Chest pain,  unspecified type  Pleurisy    Rx / DC Orders ED Discharge Orders          Ordered    naproxen (NAPROSYN) 500 MG tablet  2 times daily        09/15/22 1224    benzonatate (TESSALON) 100 MG capsule  Every 8 hours        09/15/22 1224              Dorie Rank, MD 09/16/22 [phone]

## 2022-09-15 NOTE — ED Triage Notes (Addendum)
Patient has had right sided chest pain for 2 days. No radiation. No vomiting. Recently sick with a cold. Stated it feels like her bra is too tight and her chest is squeezing.

## 2022-09-21 DIAGNOSIS — R079 Chest pain, unspecified: Secondary | ICD-10-CM | POA: Diagnosis not present

## 2022-09-21 DIAGNOSIS — R091 Pleurisy: Secondary | ICD-10-CM | POA: Diagnosis not present

## 2022-09-21 DIAGNOSIS — R071 Chest pain on breathing: Secondary | ICD-10-CM | POA: Diagnosis not present

## 2022-09-22 DIAGNOSIS — R079 Chest pain, unspecified: Secondary | ICD-10-CM | POA: Diagnosis not present

## 2022-10-31 ENCOUNTER — Encounter: Payer: Self-pay | Admitting: *Deleted

## 2022-11-16 DIAGNOSIS — R4189 Other symptoms and signs involving cognitive functions and awareness: Secondary | ICD-10-CM | POA: Diagnosis not present

## 2022-11-16 DIAGNOSIS — Z713 Dietary counseling and surveillance: Secondary | ICD-10-CM | POA: Diagnosis not present

## 2022-12-14 DIAGNOSIS — E782 Mixed hyperlipidemia: Secondary | ICD-10-CM | POA: Diagnosis not present

## 2022-12-14 DIAGNOSIS — E559 Vitamin D deficiency, unspecified: Secondary | ICD-10-CM | POA: Diagnosis not present

## 2022-12-14 DIAGNOSIS — R7303 Prediabetes: Secondary | ICD-10-CM | POA: Diagnosis not present

## 2022-12-14 DIAGNOSIS — F488 Other specified nonpsychotic mental disorders: Secondary | ICD-10-CM | POA: Diagnosis not present

## 2022-12-20 DIAGNOSIS — R7303 Prediabetes: Secondary | ICD-10-CM | POA: Diagnosis not present

## 2022-12-20 DIAGNOSIS — E669 Obesity, unspecified: Secondary | ICD-10-CM | POA: Diagnosis not present

## 2022-12-20 DIAGNOSIS — D709 Neutropenia, unspecified: Secondary | ICD-10-CM | POA: Diagnosis not present

## 2022-12-20 DIAGNOSIS — E782 Mixed hyperlipidemia: Secondary | ICD-10-CM | POA: Diagnosis not present

## 2022-12-20 DIAGNOSIS — E559 Vitamin D deficiency, unspecified: Secondary | ICD-10-CM | POA: Diagnosis not present

## 2022-12-20 DIAGNOSIS — Z8709 Personal history of other diseases of the respiratory system: Secondary | ICD-10-CM | POA: Diagnosis not present

## 2022-12-20 DIAGNOSIS — R079 Chest pain, unspecified: Secondary | ICD-10-CM | POA: Diagnosis not present

## 2022-12-21 ENCOUNTER — Telehealth: Payer: Self-pay | Admitting: Oncology

## 2022-12-21 NOTE — Telephone Encounter (Signed)
scheduled per referral, pt has been called and confirmed date and time. Pt is aware of location and to arrive early for check in   

## 2022-12-29 ENCOUNTER — Other Ambulatory Visit: Payer: Self-pay | Admitting: Family Medicine

## 2022-12-29 DIAGNOSIS — N644 Mastodynia: Secondary | ICD-10-CM

## 2023-01-03 ENCOUNTER — Other Ambulatory Visit: Payer: Self-pay | Admitting: Family Medicine

## 2023-01-03 DIAGNOSIS — E785 Hyperlipidemia, unspecified: Secondary | ICD-10-CM

## 2023-01-03 DIAGNOSIS — Z8249 Family history of ischemic heart disease and other diseases of the circulatory system: Secondary | ICD-10-CM

## 2023-01-19 NOTE — Progress Notes (Signed)
[facility] Cancer Initial Visit:  Patient Care Team: Lewis Moccasin, MD as PCP - General (Family Medicine)  CHIEF COMPLAINTS/PURPOSE OF CONSULTATION:  HISTORY OF PRESENTING ILLNESS: Sherry Russell 57 y.o. female is here because of  granulocytopenia Medical history notable for microscopic hematuria, insomnia, allergic rhinitis, vitamin D deficiency, obesity, hyperlipidemia, rotator cuff impingement right shoulder.  Lichen planus causing alopecia.     Nov 20 2020: WBC 8.1 hemoglobin 12.3 MCV 91 platelet count 338; 37 segs 56 lymphs 6 monos 1 EO 1 basophil  February 02 2021:  CT PA to evaluate right sided pleuritic chest pain negative  Dec 15, 2022: WBC 5.0 globin 12.6 platelet count 300; 29 segs 63 lymphs 5 monos 2 eos 1 basophil.  ANC 1.4 ALC 3.1 B12 315 folate 10.9 ferritin 92 CMP notable for chloride 109  December 21 2022: HIV negative  January 20 2023:  [facility]  Social:  [name] and teller.  Tobacco none.  EtOH rare  [facility] Mother alive 25 CAD, dementia, thyroid problems, DM Type II Father died 80 kidney failure, COVID, cirrhosis Sister alive 40 ? Sister alive 63 HIV Sister alive 27 lung problems, arthritis Brother half brother alive eye problems Brother alive 42 CVA Brother alive 42 epilepsy  Review of Systems  Constitutional:  Negative for appetite change, chills, fatigue, fever and unexpected weight change.  HENT:   Negative for hearing loss, lump/mass, mouth sores, nosebleeds, sore throat, tinnitus, trouble swallowing and voice change.   Eyes:  Negative for eye problems and icterus.       Vision changes:  None  Respiratory:  Negative for chest tightness and wheezing.        Has chronic pleuritic right sided chest pain since July 2023.  Slight cough without sputum production.  DOE walking up stairs.  Occasional palpitations   Cardiovascular:  Negative for leg swelling.       PND:  none Orthopnea:  none  Gastrointestinal:  Negative for  abdominal pain, blood in stool, constipation, nausea and vomiting.       Occasional dark stools.  Stools chronically loose  Endocrine: Positive for hot flashes.       Cold intolerance:  none Heat intolerance:  none  Genitourinary:  Negative for bladder incontinence, difficulty urinating, dysuria, frequency, hematuria and nocturia.   Musculoskeletal:  Negative for back pain, gait problem, neck pain and neck stiffness.       Arthralgias of elbows, knees, back.  Muscle aches involving proximal thighs  Skin:  Negative for itching, rash and wound.  Neurological:  Negative for dizziness, extremity weakness, gait problem, headaches, light-headedness, numbness, seizures and speech difficulty.  Hematological:  Negative for adenopathy. Does not bruise/bleed easily.  Psychiatric/Behavioral:  Negative for suicidal ideas. The patient is not nervous/anxious.        Hard to get to sleep and wakes up at 3AM    MEDICAL HISTORY: Past Medical History:  Diagnosis Date   Anxiety    Arrhythmia    Back injury    Depression    Hypoglycemia     SURGICAL HISTORY: Past Surgical History:  Procedure Laterality Date   ABDOMINAL HYSTERECTOMY     BTL     COLECTOMY      SOCIAL HISTORY: Social History   Socioeconomic History   Marital status: Single    Spouse name: Not on file   Number of children: Not on file   Years of education: Not on file   Highest education  level: Not on file  Occupational History   Not on file  Tobacco Use   Smoking status: Never   Smokeless tobacco: Never  Substance and Sexual Activity   Alcohol use: Not Currently    Comment: social   Drug use: No   Sexual activity: Not on file  Other Topics Concern   Not on file  Social History Narrative   Not on file   Social Determinants of Health   Financial Resource Strain: Not on file  Food Insecurity: Not on file  Transportation Needs: Not on file  Physical Activity: Not on file  Stress: Not on file  Social Connections:  Not on file  Intimate Partner Violence: Low Risk  (05/28/2020)   Received from [facility]   Intimate Partner Violence    Insults You: Not on file    Threatens You: Not on file    Screams at You: Not on file    Physically Hurt: Not on file    Intimate Partner Violence Score: Not on file    FAMILY HISTORY No family history on file.  ALLERGIES:  is allergic to shrimp [shellfish allergy] and penicillins.  MEDICATIONS:  Current Outpatient Medications  Medication Sig Dispense Refill   aspirin EC 81 MG tablet Take 81 mg by mouth every 4 (four) hours as needed for mild pain or moderate pain.     benzonatate (TESSALON) 100 MG capsule Take 1 capsule (100 mg total) by mouth every 8 (eight) hours. 21 capsule 0   buPROPion (WELLBUTRIN XL) 150 MG 24 hr tablet Take 150 mg by mouth every morning.     diclofenac (VOLTAREN) 50 MG EC tablet Take 50 mg by mouth 2 (two) times daily.     diclofenac Sodium (VOLTAREN) 1 % GEL Apply 2 g topically 4 (four) times daily.     estradiol (ESTRACE) 1 MG tablet estradiol 1 mg tablet  TAKE 1 TABLET(S) EVERY DAY BY ORAL ROUTE.     meloxicam (MOBIC) 15 MG tablet Take 1 tablet (15 mg total) by mouth daily. 15 tablet 0   naproxen (NAPROSYN) 500 MG tablet Take 1 tablet (500 mg total) by mouth 2 (two) times daily. 14 tablet 0   sertraline (ZOLOFT) 50 MG tablet Take 50 mg by mouth daily.     WEGOVY 0.5 MG/0.5ML SOAJ Inject 0.5 mg into the skin once a week.     No current facility-administered medications for this visit.    PHYSICAL EXAMINATION:  ECOG PERFORMANCE STATUS: 0 - Asymptomatic   Vitals:   01/20/23 0820  BP: (!) 122/53  Pulse: 94  Resp: 17  Temp: 98.4 F (36.9 C)  SpO2: 100%    Filed Weights   01/20/23 0820  Weight: 205 lb 8 oz (93.2 kg)     Physical Exam Vitals and nursing note reviewed.  Constitutional:      General: Sherry Russell is not in acute distress.    Appearance: Normal appearance. Sherry Russell is obese. Sherry Russell is not ill-appearing,  toxic-appearing or diaphoretic.  HENT:     Head: Normocephalic and atraumatic.     Right Ear: External ear normal.     Left Ear: External ear normal.     Nose: Nose normal. No congestion or rhinorrhea.  Eyes:     General: No scleral icterus.    Extraocular Movements: Extraocular movements intact.     Conjunctiva/sclera: Conjunctivae normal.     Pupils: Pupils are equal, round, and reactive to light.  Cardiovascular:     Rate and Rhythm:  Normal rate.     Heart sounds: No murmur heard.    No friction rub. No gallop.  Pulmonary:     Effort: Pulmonary effort is normal. No respiratory distress.     Breath sounds: Normal breath sounds. No stridor. No wheezing or rhonchi.  Abdominal:     General: Bowel sounds are normal.     Palpations: Abdomen is soft.     Tenderness: There is no abdominal tenderness. There is no guarding or rebound.  Musculoskeletal:        General: No swelling, tenderness or deformity.     Cervical back: Normal range of motion and neck supple. No rigidity or tenderness.  Lymphadenopathy:     Head:     Right side of head: No submental, submandibular, tonsillar, preauricular, posterior auricular or occipital adenopathy.     Left side of head: No submental, submandibular, tonsillar, preauricular, posterior auricular or occipital adenopathy.     Cervical: No cervical adenopathy.     Right cervical: No superficial, deep or posterior cervical adenopathy.    Left cervical: No superficial, deep or posterior cervical adenopathy.     Upper Body:     Right upper body: No supraclavicular, axillary, pectoral or epitrochlear adenopathy.     Left upper body: No supraclavicular, axillary, pectoral or epitrochlear adenopathy.  Skin:    General: Skin is warm.     Coloration: Skin is not jaundiced.  Neurological:     General: No focal deficit present.     Mental Status: Sherry Russell is alert and oriented to person, place, and time. Mental status is at baseline.     Cranial Nerves: No  cranial nerve deficit.  Psychiatric:        Mood and Affect: Mood normal.        Behavior: Behavior normal.        Thought Content: Thought content normal.        Judgment: Judgment normal.      LABORATORY DATA: I have personally reviewed the data as listed:  Appointment on 01/20/2023  Component Date Value Ref Range Status   CRP 01/20/2023 0.5  <1.0 mg/dL Final   Performed at [facility], [address], [place], [place] [address]   Sed Rate 01/20/2023 16  0 - 22 mm/hr Final   Performed at [facility], [address], [place], [place] [address]   Total CK 01/20/2023 136  38 - 234 U/L Final   Performed at [facility], [address] [place], [place] [address]   WBC MORPHOLOGY 01/20/2023 MORPHOLOGY UNREMARKABLE   Final   RBC MORPHOLOGY 01/20/2023 MORPHOLOGY UNREMARKABLE   Final   Plt Morphology 01/20/2023 Normal platelet morphology   Final   Clinical Information 01/20/2023 neutropenia   Final   Performed at [facility], [address] [place], [place] [address]   Rheumatoid fact SerPl-aCnc 01/20/2023 11.7  <14.0 IU/mL Final   Comment: (NOTE) Performed At: [facility] [address] [place], [place] [id number] Jolene Schimke MD UY:[id number]    Hepatitis B Surface Ag 01/20/2023 NON REACTIVE  NON REACTIVE Final   HCV Ab 01/20/2023 NON REACTIVE  NON REACTIVE Final   Comment: (NOTE) Nonreactive HCV antibody screen is consistent with no HCV infections,  unless recent infection is suspected or other evidence exists to indicate HCV infection.     Hep A IgM 01/20/2023 NON REACTIVE  NON REACTIVE Final   Hep B C IgM 01/20/2023 NON REACTIVE  NON  REACTIVE Final   Performed at [facility], [address], [place], [place] [address]   EBV DNA QN by PCR 01/20/2023 Negative  Negative IU/mL Final   Comment: (NOTE) No EBV DNA detected. The linear range of this assay is 35 - 100,000,000 IU/mL Performed At: [facility] [address] [place], [place] [id number] Jolene Schimke MD [id number]    ds DNA Ab 01/20/2023 1  0 - 9 IU/mL Final   Comment: (NOTE)                                   Negative      <5                                   Equivocal  5 - 9                                   Positive      >9    Ribonucleic Protein 01/20/2023 0.3  0.0 - 0.9 AI Final   ENA SM Ab Ser-aCnc 01/20/2023 <0.2  0.0 - 0.9 AI Final   Scleroderma (Scl-70) (ENA) Antibod* 01/20/2023 <0.2  0.0 - 0.9 AI Final   SSA (Ro) (ENA) Antibody, IgG 01/20/2023 1.1 (H)  0.0 - 0.9 AI Final   SSB (La) (ENA) Antibody, IgG 01/20/2023 0.2  0.0 - 0.9 AI Final   Chromatin Ab SerPl-aCnc 01/20/2023 0.2  0.0 - 0.9 AI Final   Anti JO-1 01/20/2023 <0.2  0.0 - 0.9 AI Final   Centromere Ab Screen 01/20/2023 <0.2  0.0 - 0.9 AI Final   See below: 01/20/2023 Comment   Final   Comment: (NOTE) Autoantibody                       Disease Association ------------------------------------------------------------                        Condition                  Frequency ---------------------   ------------------------   --------- Antinuclear Antibody,    SLE, mixed connective Direct (ANA-D)           tissue diseases ---------------------   ------------------------   --------- dsDNA                    SLE                        40 - 60% ---------------------   ------------------------   --------- Chromatin                Drug induced SLE                90%                         SLE                        48 - 97% ---------------------   ------------------------   --------- SSA (Ro)                 SLE  25 - 35%                         Sjogren's Syndrome         40 - 70%                         Neonatal Lupus                 100% ---------------------   ------------------------   --------- SSB (La)                 SLE                                                       10%                          Sjogren's Syndrome              30% ---------------------   -----------------------    --------- Sm (anti-Smith)          SLE                        15 - 30% ---------------------   -----------------------    --------- RNP                      Mixed Connective Tissue                         Disease                         95% (U1 nRNP,                SLE                        30 - 50% anti-ribonucleoprotein)  Polymyositis and/or                         Dermatomyositis                 20% ---------------------   ------------------------   --------- Scl-70 (antiDNA          Scleroderma (diffuse)      20 - 35% topoisomerase)           Crest                           13% ---------------------   ------------------------   --------- Jo-1                     Polymyositis and/or                         Dermatomyositis            20 - 40% ---------------------   ------------------------   --------- Centromere B             Scleroderma -  Crest                         variant                         80% Performed At: [facility] [address] [place], [place] [id number] Jolene Schimke MD [id number]    Zinc 01/20/2023 69  44 - 115 ug/dL Final   Comment: (NOTE) This test was developed and its performance characteristics determined by [facility]. It has not been cleared or approved by the Food and Drug Administration.                                Detection Limit = 5 Performed At: [facility] [address] [place], [place] [id number] Jolene Schimke MD [id number]    Copper 01/20/2023 93  80 - 158 ug/dL Final   Comment: (NOTE) This test was developed and its performance characteristics determined by [facility]. It has not been cleared or approved by the Food and Drug Administration.                                Detection Limit = 5 Performed At: [facility] [address] [place], [place] [id number] Jolene Schimke MD [id number]     Folate 01/20/2023 9.6  >5.9 ng/mL Final   Performed at [facility], [address] [place], [place] [address]   Sodium 01/20/2023 141  135 - 145 mmol/L Final   Potassium 01/20/2023 4.2  3.5 - 5.1 mmol/L Final   Chloride 01/20/2023 107  98 - 111 mmol/L Final   CO2 01/20/2023 28  22 - 32 mmol/L Final   Glucose, Bld 01/20/2023 101 (H)  70 - 99 mg/dL Final   Glucose reference range applies only to samples taken after fasting for at least 8 hours.   BUN 01/20/2023 11  6 - 20 mg/dL Final   Creatinine 56/38/7564 0.85  0.44 - 1.00 mg/dL Final   Calcium 33/29/5188 9.7  8.9 - 10.3 mg/dL Final   Total Protein 41/66/0630 7.3  6.5 - 8.1 g/dL Final   Albumin 16/07/930 4.0  3.5 - 5.0 g/dL Final   AST 35/57/3220 20  15 - 41 U/L Final   ALT 01/20/2023 18  0 - 44 U/L Final   Alkaline Phosphatase 01/20/2023 66  38 - 126 U/L Final   Total Bilirubin 01/20/2023 1.1  0.3 - 1.2 mg/dL Final   GFR, Estimated 01/20/2023 >60  >60 mL/min Final   Comment: (NOTE) Calculated using the CKD-EPI Creatinine Equation (2021)    Anion gap 01/20/2023 6  5 - 15 Final   Performed at [facility], [address], [place], [place] [address]   WBC Count 01/20/2023 5.5  4.0 - 10.5 K/uL Final   RBC 01/20/2023 4.17  3.87 - 5.11 MIL/uL Final   Hemoglobin 01/20/2023 12.9  12.0 - 15.0 g/dL Final   HCT 01/08/7627 37.5  36.0 - 46.0 % Final   MCV 01/20/2023 89.9  80.0 - 100.0 fL Final   MCH 01/20/2023 30.9  26.0 - 34.0 pg Final   MCHC 01/20/2023 34.4  30.0 - 36.0 g/dL Final   RDW 31/51/7616 12.1  11.5 - 15.5 % Final   Platelet Count 01/20/2023 289  150 - 400 K/uL Final  nRBC 01/20/2023 0.0  0.0 - 0.2 % Final   Neutrophils Relative % 01/20/2023 36  % Final   Neutro Abs 01/20/2023 2.0  1.7 - 7.7 K/uL Final   Lymphocytes Relative 01/20/2023 57  % Final   Lymphs Abs 01/20/2023 3.2  0.7 - 4.0 K/uL Final   Monocytes Relative 01/20/2023 5  % Final   Monocytes Absolute 01/20/2023 0.3  0.1  - 1.0 K/uL Final   Eosinophils Relative 01/20/2023 2  % Final   Eosinophils Absolute 01/20/2023 0.1  0.0 - 0.5 K/uL Final   Basophils Relative 01/20/2023 0  % Final   Basophils Absolute 01/20/2023 0.0  0.0 - 0.1 K/uL Final   Immature Granulocytes 01/20/2023 0  % Final   Abs Immature Granulocytes 01/20/2023 0.01  0.00 - 0.07 K/uL Final   Performed at [facility], [address] [place], [place] [address]  Office Visit on 01/20/2023  Component Date Value Ref Range Status   Retic Ct Pct 01/20/2023 1.1  0.4 - 3.1 % Final   RBC. 01/20/2023 4.14  3.87 - 5.11 MIL/uL Final   Retic Count, Absolute 01/20/2023 43.5  19.0 - 186.0 K/uL Final   Immature Retic Fract 01/20/2023 6.7  2.3 - 15.9 % Final   Performed at [facility], [address], [place], [place] [address]   Vitamin B-12 01/20/2023 184  180 - 914 pg/mL Final   Comment: (NOTE) This assay is not validated for testing neonatal or myeloproliferative syndrome specimens for Vitamin B12 levels. Performed at [facility], [address] [place], [place] [address]     RADIOGRAPHIC STUDIES: I have personally reviewed the radiological images as listed and agree with the findings in the report  No results found.  ASSESSMENT/PLAN   57 y.o. female is here because of  granulocytopenia.  Medical history notable for microscopic hematuria, insomnia, allergic rhinitis, vitamin D deficiency, obesity, hyperlipidemia, rotator cuff impingement right shoulder.  Lichen planus causing alopecia.    Granulocytopenia:   January 20 2023- Mild and without history of recurrent infection.  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     Possible causes in this patient are Causes Examples   Congenital Benign Ethnic Pure white cell aplasia Storage Disease ELA2 mutation ELANE related neutropenia Kostman   Shawachman-Diamond Syndrome Barth Syndrome Severe congenital neutropenia X-linked agammaglobinemia Storage diseases GATA2 deficiency   Infectious  Viral Hepatitis A, B, C CMV, EBV, HIV Parvovirus   Bacterial  Tuberculosis Typhoid   Rikettsial [place] Spotted fever Erlichiosis   Fungal Histoplasmosis  Autoimmune Primary Autoimmune    Thymoma SLE, RA Felty's syndrome Crohn's Disease Evans Syndrome   Nutritional Deficiencies B12 Folate Copper   Hematologic Malignancies T-cell LGL Myeloid neoplasms PNH   Bone marrow failure PNH    Initial evaluation:  CBC with diff, CMP, Morphology smear, ANA, anti-ds DNA, RF, B12, folate, copper, ESR.  Consider Hepatitis ABC, HIV, flow for lymphoma including LGL leukemia.  Quantiferon Gold.  PCR for Parvo-B19, Serologies for Brucellosis, RMSF, Erlichiosis, Histoplasmosis, Typhus, Histoplasmosis.     Cancer Staging  No matching staging information was found for the patient.    No problem-specific Assessment & Plan notes found for this encounter.    Orders Placed This Encounter  Procedures   CBC with Differential (Cancer Center Only)    Standing Status:   Future    Number of Occurrences:   1  Standing Expiration Date:   01/20/2024   CMP (Cancer Center only)    Standing Status:   Future    Number of Occurrences:   1    Standing Expiration Date:   01/20/2024   Folate    Standing Status:   Future    Number of Occurrences:   1    Standing Expiration Date:   01/20/2024   Copper, serum    Standing Status:   Future    Number of Occurrences:   1    Standing Expiration Date:   01/20/2024   Zinc    Standing Status:   Future    Number of Occurrences:   1    Standing Expiration Date:   01/20/2024   ANA Comprehensive Panel    Standing Status:   Future    Number of Occurrences:   1    Standing Expiration Date:   01/20/2024   Epstein barr vrs(ebv dna by pcr)    Standing Status:   Future    Number of Occurrences:   1    Standing  Expiration Date:   01/20/2024   Hepatitis panel, acute    Standing Status:   Future    Number of Occurrences:   1    Standing Expiration Date:   01/20/2024   Reticulocytes   Rheumatoid factor    Standing Status:   Future    Number of Occurrences:   1    Standing Expiration Date:   01/20/2024   Technologist smear review    Standing Status:   Future    Number of Occurrences:   1    Standing Expiration Date:   01/20/2024    Order Specific Question:   Clinical information:    Answer:   neutropenia   CK, total    Standing Status:   Future    Number of Occurrences:   1    Standing Expiration Date:   01/20/2024   Sedimentation rate    Standing Status:   Future    Number of Occurrences:   1    Standing Expiration Date:   01/20/2024   C-reactive protein    Standing Status:   Future    Number of Occurrences:   1    Standing Expiration Date:   01/20/2024   Vitamin B12    45  minutes was spent in patient care.  This included time spent preparing to see the patient (e.g., review of tests), obtaining and/or reviewing separately obtained history, counseling and educating the patient/family/caregiver, ordering medications, tests, or procedures; documenting clinical information in the electronic or other health record, independently interpreting results and communicating results to the patient/family/caregiver as well as coordination of care.       All questions were answered. The patient knows to call the clinic with any problems, questions or concerns.  This note was electronically signed.    Loni Muse, MD  02/13/2023 3:36 PM

## 2023-01-20 ENCOUNTER — Inpatient Hospital Stay: Payer: No Typology Code available for payment source

## 2023-01-20 ENCOUNTER — Telehealth: Payer: Self-pay

## 2023-01-20 ENCOUNTER — Other Ambulatory Visit: Payer: Self-pay

## 2023-01-20 ENCOUNTER — Inpatient Hospital Stay: Payer: No Typology Code available for payment source | Attending: Oncology | Admitting: Oncology

## 2023-01-20 VITALS — BP 122/53 | HR 94 | Temp 98.4°F | Resp 17 | Ht 68.0 in | Wt 205.5 lb

## 2023-01-20 DIAGNOSIS — Z79899 Other long term (current) drug therapy: Secondary | ICD-10-CM | POA: Insufficient documentation

## 2023-01-20 DIAGNOSIS — E669 Obesity, unspecified: Secondary | ICD-10-CM | POA: Insufficient documentation

## 2023-01-20 DIAGNOSIS — E785 Hyperlipidemia, unspecified: Secondary | ICD-10-CM | POA: Diagnosis not present

## 2023-01-20 DIAGNOSIS — L439 Lichen planus, unspecified: Secondary | ICD-10-CM | POA: Diagnosis not present

## 2023-01-20 DIAGNOSIS — D700 Congenital agranulocytosis: Secondary | ICD-10-CM | POA: Diagnosis not present

## 2023-01-20 DIAGNOSIS — M329 Systemic lupus erythematosus, unspecified: Secondary | ICD-10-CM | POA: Insufficient documentation

## 2023-01-20 DIAGNOSIS — D709 Neutropenia, unspecified: Secondary | ICD-10-CM

## 2023-01-20 DIAGNOSIS — E559 Vitamin D deficiency, unspecified: Secondary | ICD-10-CM | POA: Diagnosis not present

## 2023-01-20 LAB — CBC WITH DIFFERENTIAL (CANCER CENTER ONLY)
Abs Immature Granulocytes: 0.01 10*3/uL (ref 0.00–0.07)
Basophils Absolute: 0 10*3/uL (ref 0.0–0.1)
Basophils Relative: 0 %
Eosinophils Absolute: 0.1 10*3/uL (ref 0.0–0.5)
Eosinophils Relative: 2 %
HCT: 37.5 % (ref 36.0–46.0)
Hemoglobin: 12.9 g/dL (ref 12.0–15.0)
Immature Granulocytes: 0 %
Lymphocytes Relative: 57 %
Lymphs Abs: 3.2 10*3/uL (ref 0.7–4.0)
MCH: 30.9 pg (ref 26.0–34.0)
MCHC: 34.4 g/dL (ref 30.0–36.0)
MCV: 89.9 fL (ref 80.0–100.0)
Monocytes Absolute: 0.3 10*3/uL (ref 0.1–1.0)
Monocytes Relative: 5 %
Neutro Abs: 2 10*3/uL (ref 1.7–7.7)
Neutrophils Relative %: 36 %
Platelet Count: 289 10*3/uL (ref 150–400)
RBC: 4.17 MIL/uL (ref 3.87–5.11)
RDW: 12.1 % (ref 11.5–15.5)
WBC Count: 5.5 10*3/uL (ref 4.0–10.5)
nRBC: 0 % (ref 0.0–0.2)

## 2023-01-20 LAB — CMP (CANCER CENTER ONLY)
ALT: 18 U/L (ref 0–44)
AST: 20 U/L (ref 15–41)
Albumin: 4 g/dL (ref 3.5–5.0)
Alkaline Phosphatase: 66 U/L (ref 38–126)
Anion gap: 6 (ref 5–15)
BUN: 11 mg/dL (ref 6–20)
CO2: 28 mmol/L (ref 22–32)
Calcium: 9.7 mg/dL (ref 8.9–10.3)
Chloride: 107 mmol/L (ref 98–111)
Creatinine: 0.85 mg/dL (ref 0.44–1.00)
GFR, Estimated: >60 mL/min (ref >60)
Glucose, Bld: 101 mg/dL — ABNORMAL HIGH (ref 70–99)
Potassium: 4.2 mmol/L (ref 3.5–5.1)
Sodium: 141 mmol/L (ref 135–145)
Total Bilirubin: 1.1 mg/dL (ref 0.3–1.2)
Total Protein: 7.3 g/dL (ref 6.5–8.1)

## 2023-01-20 LAB — RETICULOCYTES
Immature Retic Fract: 6.7 % (ref 2.3–15.9)
RBC.: 4.14 MIL/uL (ref 3.87–5.11)
Retic Count, Absolute: 43.5 10*3/uL (ref 19.0–186.0)
Retic Ct Pct: 1.1 % (ref 0.4–3.1)

## 2023-01-20 LAB — VITAMIN B12: Vitamin B-12: 184 pg/mL (ref 180–914)

## 2023-01-20 LAB — TECHNOLOGIST SMEAR REVIEW: Plt Morphology: NORMAL

## 2023-01-20 LAB — HEPATITIS PANEL, ACUTE
HCV Ab: NONREACTIVE
Hep A IgM: NONREACTIVE
Hep B C IgM: NONREACTIVE
Hepatitis B Surface Ag: NONREACTIVE

## 2023-01-20 LAB — SEDIMENTATION RATE: Sed Rate: 16 mm/hr (ref 0–22)

## 2023-01-20 LAB — FOLATE: Folate: 9.6 ng/mL (ref >5.9)

## 2023-01-20 LAB — CK: Total CK: 136 U/L (ref 38–234)

## 2023-01-20 LAB — C-REACTIVE PROTEIN: CRP: 0.5 mg/dL (ref <1.0)

## 2023-01-20 NOTE — Telephone Encounter (Addendum)
Called patient to relay message below as per Dr. Angelene Giovanni. Patient voiced full understanding.    ----- Message from Loni Muse, MD sent at 01/20/2023  2:59 PM EDT ----- Please have patient begin vitamin B12 1000 mcg under the tongue daily Thanks ----- Message ----- From: Leory Plowman, [facility] [place] Sent: 01/20/2023  10:10 AM EDT To: Loni Muse, MD

## 2023-01-22 LAB — RHEUMATOID FACTOR: Rheumatoid fact SerPl-aCnc: 11.7 IU/mL (ref <14.0)

## 2023-01-23 ENCOUNTER — Encounter: Payer: Self-pay | Admitting: Oncology

## 2023-01-23 ENCOUNTER — Telehealth: Payer: Self-pay | Admitting: Oncology

## 2023-01-23 LAB — ANA COMPREHENSIVE PANEL
Anti JO-1: <0.2 AI (ref 0.0–0.9)
Centromere Ab Screen: <0.2 AI (ref 0.0–0.9)
Chromatin Ab SerPl-aCnc: 0.2 AI (ref 0.0–0.9)
ENA SM Ab Ser-aCnc: <0.2 AI (ref 0.0–0.9)
Ribonucleic Protein: 0.3 AI (ref 0.0–0.9)
SSA (Ro) (ENA) Antibody, IgG: 1.1 AI — ABNORMAL HIGH (ref 0.0–0.9)
SSB (La) (ENA) Antibody, IgG: 0.2 AI (ref 0.0–0.9)
Scleroderma (Scl-70) (ENA) Antibody, IgG: <0.2 AI (ref 0.0–0.9)
ds DNA Ab: 1 IU/mL (ref 0–9)

## 2023-01-23 LAB — EPSTEIN BARR VRS(EBV DNA BY PCR): EBV DNA QN by PCR: NEGATIVE IU/mL

## 2023-01-23 NOTE — Telephone Encounter (Signed)
Spoke with patient confirming upcoming appointment  

## 2023-01-24 LAB — ZINC: Zinc: 69 ug/dL (ref 44–115)

## 2023-01-24 LAB — COPPER, SERUM: Copper: 93 ug/dL (ref 80–158)

## 2023-01-30 ENCOUNTER — Encounter: Payer: Self-pay | Admitting: Oncology

## 2023-02-07 ENCOUNTER — Ambulatory Visit
Admission: RE | Admit: 2023-02-07 | Discharge: 2023-02-07 | Discharge status: Home / Self Care | Payer: No Typology Code available for payment source | Source: Ambulatory Visit | Attending: Family Medicine | Admitting: Family Medicine

## 2023-02-07 DIAGNOSIS — Z8249 Family history of ischemic heart disease and other diseases of the circulatory system: Secondary | ICD-10-CM

## 2023-02-07 DIAGNOSIS — E785 Hyperlipidemia, unspecified: Secondary | ICD-10-CM

## 2023-02-16 NOTE — Progress Notes (Signed)
[facility] Initial Visit:  Patient Care Team: Sherry Moccasin, MD as PCP - General (Family Medicine)  CHIEF COMPLAINTS/PURPOSE OF CONSULTATION:  HISTORY OF PRESENTING ILLNESS: Sherry [facility] 57 y.o. female is here because of  granulocytopenia Medical history notable for microscopic hematuria, insomnia, allergic rhinitis, vitamin D deficiency, obesity, hyperlipidemia, rotator cuff impingement right shoulder.  Lichen planus causing alopecia.     Nov 20 2020: WBC 8.1 hemoglobin 12.3 MCV 91 platelet count 338; 37 segs 56 lymphs 6 monos 1 EO 1 basophil  February 02 2021:  CT PA to evaluate right sided pleuritic chest pain negative  Dec 15, 2022: WBC 5.0 globin 12.6 platelet count 300; 29 segs 63 lymphs 5 monos 2 eos 1 basophil.  ANC 1.4 ALC 3.1 B12 315 folate 10.9 ferritin 92 CMP notable for chloride 109  December 21 2022: HIV negative  January 20 2023:  [facility] H[facility]ology Consult  Social:  Sherry Russell and Sherry Russell.  Tobacco none.  EtOH rare  Sherry [facility] Mother alive 1 CAD, dementia, thyroid problems, DM Type II Father died 47 kidney failure, COVID, cirrhosis Sister alive 59 ? Sister alive 73 HIV Sister alive 68 lung problems, arthritis Brother half brother alive eye problems Brother alive 78 CVA Brother alive 36 epilepsy  WBC 5.5 Hgb 12.9 MCV 90 PLT 289; 36 seg 57 lymph 5 mono 2 eos.  ANC 2.0 Retic count 1.1 Folate 9.6 Copper 93 B12 184 Zinc 69 ANA panel notable for SSA 1.1 (ULN 0.9)  RF negative ESR 16 CRP 0.5 CK 136 Hepatitis ABC serologies EBV PCR negative CMP notable for Glu 101  February 07 2023:  CT chest (cardiac score) normal.    February 17 2023:  Scheduled follow up for granulocytopenia.  Reviewed results of labs with patient Still having right sided chest pain.     Review of Systems  Constitutional:  Negative for appetite change, chills, fatigue, fever and unexpected weight change.  HENT:   Negative for hearing loss, lump/mass, mouth sores, nosebleeds, sore  throat, tinnitus, trouble swallowing and voice change.   Eyes:  Negative for eye problems and icterus.       Vision changes:  None  Respiratory:  Negative for chest tightness and wheezing.        Has chronic pleuritic right sided chest pain since July 2023.  Slight cough without sputum production.  DOE walking up stairs.  Occasional palpitations   Cardiovascular:  Negative for leg swelling.       PND:  none Orthopnea:  none  Gastrointestinal:  Negative for abdominal pain, blood in stool, constipation, nausea and vomiting.       Occasional dark stools.  Stools chronically loose  Endocrine: Positive for hot flashes.       Cold intolerance:  none Heat intolerance:  none  Genitourinary:  Negative for bladder incontinence, difficulty urinating, dysuria, frequency, hematuria and nocturia.   Musculoskeletal:  Negative for back pain, gait problem, neck pain and neck stiffness.       Arthralgias of elbows, knees, back.  Muscle aches involving proximal thighs  Skin:  Negative for itching, rash and wound.  Neurological:  Negative for dizziness, extremity weakness, gait problem, headaches, light-headedness, numbness, seizures and speech difficulty.  Hematological:  Negative for adenopathy. Does not bruise/bleed easily.  Psychiatric/Behavioral:  Negative for suicidal ideas. The patient is not nervous/anxious.        Hard to get to sleep and wakes up at 3AM    MEDICAL HISTORY: Past Medical  History:  Diagnosis Date   Anxiety    Arrhythmia    Back injury    Depression    Hypoglycemia     SURGICAL HISTORY: Past Surgical History:  Procedure Laterality Date   ABDOMINAL HYSTERECTOMY     BTL     COLECTOMY      SOCIAL HISTORY: Social History   Socioeconomic History   Marital status: Single    Spouse name: Not on file   Number of children: Not on file   Years of education: Not on file   Highest education level: Not on file  Occupational History   Not on file  Tobacco Use   Smoking  status: Never   Smokeless tobacco: Never  Substance and Sexual Activity   Alcohol use: Not Currently    Comment: social   Drug use: No   Sexual activity: Not on file  Other Topics Concern   Not on file  Social History Narrative   Not on file   Social Determinants of Health   Financial Resource Strain: Not on file  Food Insecurity: Not on file  Transportation Needs: Not on file  Physical Activity: Not on file  Stress: Not on file  Social Connections: Not on file  Intimate Partner Violence: Low Risk  (05/28/2020)   Received from Sherry Russell   Intimate Partner Violence    Insults You: Not on file    Threatens You: Not on file    Screams at You: Not on file    Physically Hurt: Not on file    Intimate Partner Violence Score: Not on file    FAMILY HISTORY No family history on file.  ALLERGIES:  is allergic to shrimp [shellfish allergy] and penicillins.  MEDICATIONS:  Current Outpatient Medications  Medication Sig Dispense Refill   aspirin EC 81 MG tablet Take 81 mg by mouth every 4 (four) hours as needed for mild pain or moderate pain.     benzonatate (TESSALON) 100 MG capsule Take 1 capsule (100 mg total) by mouth every 8 (eight) hours. 21 capsule 0   buPROPion (WELLBUTRIN XL) 150 MG 24 hr tablet Take 150 mg by mouth every morning.     diclofenac (VOLTAREN) 50 MG EC tablet Take 50 mg by mouth 2 (two) times daily.     diclofenac Sodium (VOLTAREN) 1 % GEL Apply 2 g topically 4 (four) times daily.     estradiol (ESTRACE) 1 MG tablet estradiol 1 mg tablet  TAKE 1 TABLET(S) EVERY DAY BY ORAL ROUTE.     meloxicam (MOBIC) 15 MG tablet Take 1 tablet (15 mg total) by mouth daily. 15 tablet 0   naproxen (NAPROSYN) 500 MG tablet Take 1 tablet (500 mg total) by mouth 2 (two) times daily. 14 tablet 0   sertraline (ZOLOFT) 50 MG tablet Take 50 mg by mouth daily.     WEGOVY 0.5 MG/0.5ML SOAJ Inject 0.5 mg into the skin once a week.     No current facility-administered medications for  this visit.    PHYSICAL EXAMINATION:  ECOG PERFORMANCE STATUS: 0 - Asymptomatic   There were no vitals filed for this visit.   There were no vitals filed for this visit.    Physical Exam Vitals and nursing note reviewed.  Constitutional:      General: She is not in acute distress.    Appearance: Normal appearance. She is obese. She is not ill-appearing, toxic-appearing or diaphoretic.  HENT:     Head: Normocephalic and atraumatic.  Right Ear: External ear normal.     Left Ear: External ear normal.     Nose: Nose normal. No congestion or rhinorrhea.  Eyes:     General: No scleral icterus.    Extraocular Movements: Extraocular movements intact.     Conjunctiva/sclera: Conjunctivae normal.     Pupils: Pupils are equal, round, and reactive to light.  Cardiovascular:     Rate and Rhythm: Normal rate.     Heart sounds: No murmur heard.    No friction rub. No gallop.  Pulmonary:     Effort: Pulmonary effort is normal. No respiratory distress.     Breath sounds: Normal breath sounds. No stridor. No wheezing or rhonchi.  Abdominal:     General: Bowel sounds are normal.     Palpations: Abdomen is soft.     Tenderness: There is no abdominal tenderness. There is no guarding or rebound.  Musculoskeletal:        General: No swelling, tenderness or deformity.     Cervical back: Normal range of motion and neck supple. No rigidity or tenderness.  Lymphadenopathy:     Head:     Right side of head: No submental, submandibular, tonsillar, preauricular, posterior auricular or occipital adenopathy.     Left side of head: No submental, submandibular, tonsillar, preauricular, posterior auricular or occipital adenopathy.     Cervical: No cervical adenopathy.     Right cervical: No superficial, deep or posterior cervical adenopathy.    Left cervical: No superficial, deep or posterior cervical adenopathy.     Upper Body:     Right upper body: No supraclavicular, axillary, pectoral or  epitrochlear adenopathy.     Left upper body: No supraclavicular, axillary, pectoral or epitrochlear adenopathy.  Skin:    General: Skin is warm.     Coloration: Skin is not jaundiced.  Neurological:     General: No focal deficit present.     Mental Status: She is alert and oriented to person, place, and time. Mental status is at baseline.     Cranial Nerves: No cranial nerve deficit.  Psychiatric:        Mood and Affect: Mood normal.        Behavior: Behavior normal.        Thought Content: Thought content normal.        Judgment: Judgment normal.      LABORATORY DATA: I have personally reviewed the data as listed:  Appointment on 01/20/2023  Component Date Value Ref Range Status   CRP 01/20/2023 0.5  <1.0 mg/dL Final   Performed at [facility], [address], [place], [place] [address]   Sed Rate 01/20/2023 16  0 - 22 mm/hr Final   Performed at [facility], [address] [place], [place] [address]   Total CK 01/20/2023 136  38 - 234 U/L Final   Performed at [facility], [address] [place], [place] [address]   WBC MORPHOLOGY 01/20/2023 MORPHOLOGY UNREMARKABLE   Final   RBC MORPHOLOGY 01/20/2023 MORPHOLOGY UNREMARKABLE   Final   Plt Morphology 01/20/2023 Normal platelet morphology   Final   Clinical Information 01/20/2023 neutropenia   Final   Performed at [facility], [address], [place], [place] [address]   Rheumatoid fact SerPl-aCnc 01/20/2023 11.7  <14.0 IU/mL Final   Comment: (NOTE) Performed At: [facility] [address] [place], [place] [id number] Jolene Schimke MD MV:[id number]    Hepatitis B Surface  Ag 01/20/2023 NON REACTIVE  NON REACTIVE Final   HCV Ab 01/20/2023 NON REACTIVE  NON REACTIVE Final   Comment: (NOTE) Nonreactive HCV antibody screen is consistent with no HCV infections,  unless recent infection is suspected or other evidence exists to indicate HCV infection.     Hep  A IgM 01/20/2023 NON REACTIVE  NON REACTIVE Final   Hep B C IgM 01/20/2023 NON REACTIVE  NON REACTIVE Final   Performed at [facility], [address], [place], [place] [address]   EBV DNA QN by PCR 01/20/2023 Negative  Negative IU/mL Final   Comment: (NOTE) No EBV DNA detected. The linear range of this assay is 35 - 100,000,000 IU/mL Performed At: [facility] [address] [place], [place] [id number] Jolene Schimke MD [id number]    ds DNA Ab 01/20/2023 1  0 - 9 IU/mL Final   Comment: (NOTE)                                   Negative      <5                                   Equivocal  5 - 9                                   Positive      >9    Ribonucleic Protein 01/20/2023 0.3  0.0 - 0.9 AI Final   ENA SM Ab Ser-aCnc 01/20/2023 <0.2  0.0 - 0.9 AI Final   Scleroderma (Scl-70) (ENA) Antibod* 01/20/2023 <0.2  0.0 - 0.9 AI Final   SSA (Ro) (ENA) Antibody, IgG 01/20/2023 1.1 (H)  0.0 - 0.9 AI Final   SSB (La) (ENA) Antibody, IgG 01/20/2023 0.2  0.0 - 0.9 AI Final   Chromatin Ab SerPl-aCnc 01/20/2023 0.2  0.0 - 0.9 AI Final   Anti JO-1 01/20/2023 <0.2  0.0 - 0.9 AI Final   Centromere Ab Screen 01/20/2023 <0.2  0.0 - 0.9 AI Final   See below: 01/20/2023 Comment   Final   Comment: (NOTE) Autoantibody                       Disease Association ------------------------------------------------------------                        Condition                  Frequency ---------------------   ------------------------   --------- Antinuclear Antibody,    SLE, mixed connective Direct (ANA-D)           tissue diseases ---------------------   ------------------------   --------- dsDNA                    SLE                        40 - 60% ---------------------   ------------------------   --------- Chromatin                Drug induced SLE                90%  SLE                        48 - 97% ---------------------   ------------------------    --------- SSA (Ro)                 SLE                        25 - 35%                         Sjogren's Syndrome         40 - 70%                         Neonatal Lupus                 100% ---------------------   ------------------------   --------- SSB (La)                 SLE                                                       10%                         Sjogren's Syndrome              30% ---------------------   -----------------------    --------- Sm (anti-Smith)          SLE                        15 - 30% ---------------------   -----------------------    --------- RNP                      Mixed Connective Tissue                         Disease                         95% (U1 nRNP,                SLE                        30 - 50% anti-ribonucleoprotein)  Polymyositis and/or                         Dermatomyositis                 20% ---------------------   ------------------------   --------- Scl-70 (antiDNA          Scleroderma (diffuse)      20 - 35% topoisomerase)           Crest                           13% ---------------------   ------------------------   --------- Jo-1                     Polymyositis and/or  Dermatomyositis            20 - 40% ---------------------   ------------------------   --------- Centromere B             Scleroderma -                           Crest                         variant                         80% Performed At: [facility] [place] [address] [place], [place] [id number] Jolene Schimke MD [id number]    Zinc 01/20/2023 69  44 - 115 ug/dL Final   Comment: (NOTE) This test was developed and its performance characteristics determined by [facility]. It has not been cleared or approved by the Food and Drug Administration.                                Detection Limit = 5 Performed At: [facility] [address] [place], [place] [id number] Jolene Schimke MD [id number]    Copper 01/20/2023  93  80 - 158 ug/dL Final   Comment: (NOTE) This test was developed and its performance characteristics determined by [facility]. It has not been cleared or approved by the Food and Drug Administration.                                Detection Limit = 5 Performed At: [facility] [address] [place], [place] [id number] Jolene Schimke MD [id number]    Folate 01/20/2023 9.6  >5.9 ng/mL Final   Performed at [facility], [address], [place], [place] [address]   Sodium 01/20/2023 141  135 - 145 mmol/L Final   Potassium 01/20/2023 4.2  3.5 - 5.1 mmol/L Final   Chloride 01/20/2023 107  98 - 111 mmol/L Final   CO2 01/20/2023 28  22 - 32 mmol/L Final   Glucose, Bld 01/20/2023 101 (H)  70 - 99 mg/dL Final   Glucose reference range applies only to samples taken after fasting for at least 8 hours.   BUN 01/20/2023 11  6 - 20 mg/dL Final   Creatinine 56/38/7564 0.85  0.44 - 1.00 mg/dL Final   Calcium 33/29/5188 9.7  8.9 - 10.3 mg/dL Final   Total Protein 41/66/0630 7.3  6.5 - 8.1 g/dL Final   Albumin 16/07/930 4.0  3.5 - 5.0 g/dL Final   AST 35/57/3220 20  15 - 41 U/L Final   ALT 01/20/2023 18  0 - 44 U/L Final   Alkaline Phosphatase 01/20/2023 66  38 - 126 U/L Final   Total Bilirubin 01/20/2023 1.1  0.3 - 1.2 mg/dL Final   GFR, Estimated 01/20/2023 >60  >60 mL/min Final   Comment: (NOTE) Calculated using the CKD-EPI Creatinine Equation (2021)    Anion gap 01/20/2023 6  5 - 15 Final   Performed at [facility], [address] [place], [place] [address]   WBC Count 01/20/2023 5.5  4.0 - 10.5 K/uL Final   RBC 01/20/2023 4.17  3.87 - 5.11 MIL/uL Final   Hemoglobin 01/20/2023 12.9  12.0 - 15.0 g/dL Final   HCT 01/08/7627 37.5  36.0 - 46.0 % Final   MCV 01/20/2023 89.9  80.0 - 100.0 fL Final   MCH 01/20/2023 30.9  26.0 - 34.0 pg Final   MCHC 01/20/2023 34.4  30.0 - 36.0 g/dL Final   RDW 16/04/9603 12.1  11.5 - 15.5 % Final   Platelet  Count 01/20/2023 289  150 - 400 K/uL Final   nRBC 01/20/2023 0.0  0.0 - 0.2 % Final   Neutrophils Relative % 01/20/2023 36  % Final   Neutro Abs 01/20/2023 2.0  1.7 - 7.7 K/uL Final   Lymphocytes Relative 01/20/2023 57  % Final   Lymphs Abs 01/20/2023 3.2  0.7 - 4.0 K/uL Final   Monocytes Relative 01/20/2023 5  % Final   Monocytes Absolute 01/20/2023 0.3  0.1 - 1.0 K/uL Final   Eosinophils Relative 01/20/2023 2  % Final   Eosinophils Absolute 01/20/2023 0.1  0.0 - 0.5 K/uL Final   Basophils Relative 01/20/2023 0  % Final   Basophils Absolute 01/20/2023 0.0  0.0 - 0.1 K/uL Final   Immature Granulocytes 01/20/2023 0  % Final   Abs Immature Granulocytes 01/20/2023 0.01  0.00 - 0.07 K/uL Final   Performed at [facility], [address], [place], [place] [address]  Office Visit on 01/20/2023  Component Date Value Ref Range Status   Retic Ct Pct 01/20/2023 1.1  0.4 - 3.1 % Final   RBC. 01/20/2023 4.14  3.87 - 5.11 MIL/uL Final   Retic Count, Absolute 01/20/2023 43.5  19.0 - 186.0 K/uL Final   Immature Retic Fract 01/20/2023 6.7  2.3 - 15.9 % Final   Performed at [facility], [address], [place], [place] [address]   Vitamin B-12 01/20/2023 184  180 - 914 pg/mL Final   Comment: (NOTE) This assay is not validated for testing neonatal or myeloproliferative syndrome specimens for Vitamin B12 levels. Performed at [facility], [address], [place], [place] [address]     RADIOGRAPHIC STUDIES: I have personally reviewed the radiological images as listed and agree with the findings in the report  No results found.  ASSESSMENT/PLAN   57 y.o. AA female is here because of  granulocytopenia.  Medical history notable for microscopic hematuria, insomnia, allergic rhinitis, vitamin D deficiency, obesity, hyperlipidemia, rotator cuff impingement right shoulder.  Lichen planus causing alopecia.    Granulocytopenia:   January 20 2023- Mild and without history of recurrent infection.  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  Patient dies have history of loose stools so can consider endoscopy to evaluate for inflammatory bowel disease.  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  No evidence of thymic enlargement on recent CT chest    Cancer Staging  No matching staging information was found for the patient.  No problem-specific Assessment & Plan notes found for this encounter.    No orders of the defined types were placed in this encounter.   30  minutes was spent in patient care.  This included time spent preparing to see the patient (e.g., review of tests), obtaining and/or reviewing separately obtained history, counseling and educating the patient/family/caregiver, ordering medications, tests, or procedures; documenting clinical information in the electronic or other health record, independently interpreting results and communicating results to the patient/family/caregiver as well as coordination of care.       All questions were answered. The patient knows to call the [facility] with any problems, questions or concerns.  This note was electronically signed.    Loni Muse, MD   02/16/2023 2:25 PM

## 2023-02-17 ENCOUNTER — Other Ambulatory Visit: Payer: Self-pay

## 2023-02-17 ENCOUNTER — Inpatient Hospital Stay: Payer: No Typology Code available for payment source | Attending: Oncology | Admitting: Oncology

## 2023-02-17 VITALS — BP 131/61 | HR 86 | Temp 98.2°F | Resp 18 | Ht 67.0 in | Wt 207.1 lb

## 2023-02-17 DIAGNOSIS — D709 Neutropenia, unspecified: Secondary | ICD-10-CM | POA: Diagnosis not present

## 2023-02-17 DIAGNOSIS — L439 Lichen planus, unspecified: Secondary | ICD-10-CM | POA: Diagnosis not present

## 2023-04-03 DIAGNOSIS — I951 Orthostatic hypotension: Secondary | ICD-10-CM | POA: Diagnosis not present

## 2023-04-03 DIAGNOSIS — Z0000 Encounter for general adult medical examination without abnormal findings: Secondary | ICD-10-CM | POA: Diagnosis not present

## 2023-04-03 DIAGNOSIS — Z13220 Encounter for screening for lipoid disorders: Secondary | ICD-10-CM | POA: Diagnosis not present

## 2023-04-03 DIAGNOSIS — R5383 Other fatigue: Secondary | ICD-10-CM | POA: Diagnosis not present

## 2023-04-03 DIAGNOSIS — E669 Obesity, unspecified: Secondary | ICD-10-CM | POA: Diagnosis not present

## 2023-04-03 DIAGNOSIS — Z114 Encounter for screening for human immunodeficiency virus [HIV]: Secondary | ICD-10-CM | POA: Diagnosis not present

## 2023-04-03 DIAGNOSIS — E782 Mixed hyperlipidemia: Secondary | ICD-10-CM | POA: Diagnosis not present

## 2023-04-03 DIAGNOSIS — E559 Vitamin D deficiency, unspecified: Secondary | ICD-10-CM | POA: Diagnosis not present

## 2023-04-11 ENCOUNTER — Other Ambulatory Visit: Payer: Self-pay | Admitting: Family Medicine

## 2023-04-11 DIAGNOSIS — Z0000 Encounter for general adult medical examination without abnormal findings: Secondary | ICD-10-CM | POA: Diagnosis not present

## 2023-04-11 DIAGNOSIS — Z23 Encounter for immunization: Secondary | ICD-10-CM | POA: Diagnosis not present

## 2023-04-11 DIAGNOSIS — Z1231 Encounter for screening mammogram for malignant neoplasm of breast: Secondary | ICD-10-CM

## 2023-04-12 DIAGNOSIS — E559 Vitamin D deficiency, unspecified: Secondary | ICD-10-CM | POA: Diagnosis not present

## 2023-04-12 DIAGNOSIS — L439 Lichen planus, unspecified: Secondary | ICD-10-CM | POA: Diagnosis not present

## 2023-04-12 DIAGNOSIS — R0683 Snoring: Secondary | ICD-10-CM | POA: Diagnosis not present

## 2023-04-12 DIAGNOSIS — R768 Other specified abnormal immunological findings in serum: Secondary | ICD-10-CM | POA: Diagnosis not present

## 2023-04-12 DIAGNOSIS — E669 Obesity, unspecified: Secondary | ICD-10-CM | POA: Diagnosis not present

## 2023-04-12 DIAGNOSIS — R400 Somnolence: Secondary | ICD-10-CM | POA: Diagnosis not present

## 2023-04-27 DIAGNOSIS — F32A Depression, unspecified: Secondary | ICD-10-CM | POA: Diagnosis not present

## 2023-04-27 DIAGNOSIS — Z860100 Personal history of colon polyps, unspecified: Secondary | ICD-10-CM | POA: Diagnosis not present

## 2023-04-27 DIAGNOSIS — E669 Obesity, unspecified: Secondary | ICD-10-CM | POA: Diagnosis not present

## 2023-04-27 DIAGNOSIS — Z1211 Encounter for screening for malignant neoplasm of colon: Secondary | ICD-10-CM | POA: Diagnosis not present

## 2023-05-09 DIAGNOSIS — Z1231 Encounter for screening mammogram for malignant neoplasm of breast: Secondary | ICD-10-CM | POA: Diagnosis not present

## 2023-05-09 DIAGNOSIS — Z01419 Encounter for gynecological examination (general) (routine) without abnormal findings: Secondary | ICD-10-CM | POA: Diagnosis not present

## 2023-05-12 ENCOUNTER — Ambulatory Visit: Payer: No Typology Code available for payment source

## 2023-05-12 DIAGNOSIS — R768 Other specified abnormal immunological findings in serum: Secondary | ICD-10-CM | POA: Diagnosis not present

## 2023-05-12 DIAGNOSIS — M2550 Pain in unspecified joint: Secondary | ICD-10-CM | POA: Diagnosis not present

## 2023-05-15 ENCOUNTER — Telehealth: Payer: Self-pay | Admitting: Oncology

## 2023-05-15 NOTE — Telephone Encounter (Signed)
Called patient to advise of rescheduled dated and time. Message left of date and time of appointment.

## 2023-05-15 NOTE — Telephone Encounter (Signed)
Called patient after receiving call to cancel 11/1 follow up with Dr. Arlana Pouch. Patient was very upset after last visit with Dr. Angelene Giovanni and found another MD to take care of her needs. Patient has been given the option to call back and be scheduled with new MD if she chooses.

## 2023-05-16 DIAGNOSIS — E66811 Obesity, class 1: Secondary | ICD-10-CM | POA: Diagnosis not present

## 2023-05-16 DIAGNOSIS — R768 Other specified abnormal immunological findings in serum: Secondary | ICD-10-CM | POA: Diagnosis not present

## 2023-05-16 DIAGNOSIS — E785 Hyperlipidemia, unspecified: Secondary | ICD-10-CM | POA: Diagnosis not present

## 2023-05-19 ENCOUNTER — Ambulatory Visit: Payer: No Typology Code available for payment source | Admitting: Oncology

## 2023-06-06 DIAGNOSIS — Z980 Intestinal bypass and anastomosis status: Secondary | ICD-10-CM | POA: Diagnosis not present

## 2023-06-06 DIAGNOSIS — Z1211 Encounter for screening for malignant neoplasm of colon: Secondary | ICD-10-CM | POA: Diagnosis not present

## 2023-06-06 DIAGNOSIS — D123 Benign neoplasm of transverse colon: Secondary | ICD-10-CM | POA: Diagnosis not present

## 2023-06-06 DIAGNOSIS — Z860101 Personal history of adenomatous and serrated colon polyps: Secondary | ICD-10-CM | POA: Diagnosis not present

## 2023-06-06 DIAGNOSIS — K635 Polyp of colon: Secondary | ICD-10-CM | POA: Diagnosis not present

## 2023-06-09 DIAGNOSIS — R768 Other specified abnormal immunological findings in serum: Secondary | ICD-10-CM | POA: Diagnosis not present

## 2023-06-09 DIAGNOSIS — G4733 Obstructive sleep apnea (adult) (pediatric): Secondary | ICD-10-CM | POA: Diagnosis not present

## 2023-06-09 DIAGNOSIS — R1310 Dysphagia, unspecified: Secondary | ICD-10-CM | POA: Diagnosis not present

## 2023-06-09 DIAGNOSIS — G8929 Other chronic pain: Secondary | ICD-10-CM | POA: Diagnosis not present

## 2023-06-09 DIAGNOSIS — M3500 Sicca syndrome, unspecified: Secondary | ICD-10-CM | POA: Diagnosis not present

## 2023-06-27 DIAGNOSIS — L439 Lichen planus, unspecified: Secondary | ICD-10-CM | POA: Diagnosis not present

## 2023-06-27 DIAGNOSIS — E669 Obesity, unspecified: Secondary | ICD-10-CM | POA: Diagnosis not present

## 2023-06-27 DIAGNOSIS — R768 Other specified abnormal immunological findings in serum: Secondary | ICD-10-CM | POA: Diagnosis not present

## 2023-06-27 DIAGNOSIS — M545 Low back pain, unspecified: Secondary | ICD-10-CM | POA: Diagnosis not present

## 2023-06-27 DIAGNOSIS — Z6831 Body mass index (BMI) 31.0-31.9, adult: Secondary | ICD-10-CM | POA: Diagnosis not present

## 2023-07-04 DIAGNOSIS — Z980 Intestinal bypass and anastomosis status: Secondary | ICD-10-CM | POA: Diagnosis not present

## 2023-07-04 DIAGNOSIS — K219 Gastro-esophageal reflux disease without esophagitis: Secondary | ICD-10-CM | POA: Diagnosis not present

## 2023-07-04 DIAGNOSIS — R1310 Dysphagia, unspecified: Secondary | ICD-10-CM | POA: Diagnosis not present

## 2023-07-04 DIAGNOSIS — E669 Obesity, unspecified: Secondary | ICD-10-CM | POA: Diagnosis not present

## 2023-07-06 DIAGNOSIS — K2970 Gastritis, unspecified, without bleeding: Secondary | ICD-10-CM | POA: Diagnosis not present

## 2023-07-06 DIAGNOSIS — K2289 Other specified disease of esophagus: Secondary | ICD-10-CM | POA: Diagnosis not present

## 2023-07-06 DIAGNOSIS — R1310 Dysphagia, unspecified: Secondary | ICD-10-CM | POA: Diagnosis not present

## 2023-07-06 DIAGNOSIS — K219 Gastro-esophageal reflux disease without esophagitis: Secondary | ICD-10-CM | POA: Diagnosis not present

## 2023-07-20 DIAGNOSIS — Z860100 Personal history of colon polyps, unspecified: Secondary | ICD-10-CM | POA: Diagnosis not present

## 2023-07-20 DIAGNOSIS — K219 Gastro-esophageal reflux disease without esophagitis: Secondary | ICD-10-CM | POA: Diagnosis not present

## 2023-07-20 DIAGNOSIS — K582 Mixed irritable bowel syndrome: Secondary | ICD-10-CM | POA: Diagnosis not present

## 2023-07-20 DIAGNOSIS — E669 Obesity, unspecified: Secondary | ICD-10-CM | POA: Diagnosis not present

## 2023-07-25 DIAGNOSIS — G4733 Obstructive sleep apnea (adult) (pediatric): Secondary | ICD-10-CM | POA: Diagnosis not present

## 2023-08-15 DIAGNOSIS — M5451 Vertebrogenic low back pain: Secondary | ICD-10-CM | POA: Diagnosis not present

## 2023-08-15 DIAGNOSIS — M51369 Other intervertebral disc degeneration, lumbar region without mention of lumbar back pain or lower extremity pain: Secondary | ICD-10-CM | POA: Diagnosis not present

## 2023-08-25 DIAGNOSIS — G4733 Obstructive sleep apnea (adult) (pediatric): Secondary | ICD-10-CM | POA: Diagnosis not present

## 2023-09-01 DIAGNOSIS — M3500 Sicca syndrome, unspecified: Secondary | ICD-10-CM | POA: Diagnosis not present

## 2023-09-01 DIAGNOSIS — G4733 Obstructive sleep apnea (adult) (pediatric): Secondary | ICD-10-CM | POA: Diagnosis not present

## 2023-09-01 DIAGNOSIS — M51369 Other intervertebral disc degeneration, lumbar region without mention of lumbar back pain or lower extremity pain: Secondary | ICD-10-CM | POA: Diagnosis not present

## 2023-09-04 DIAGNOSIS — R7303 Prediabetes: Secondary | ICD-10-CM | POA: Diagnosis not present

## 2023-09-11 DIAGNOSIS — R509 Fever, unspecified: Secondary | ICD-10-CM | POA: Diagnosis not present

## 2023-09-11 DIAGNOSIS — R059 Cough, unspecified: Secondary | ICD-10-CM | POA: Diagnosis not present

## 2023-09-11 DIAGNOSIS — U071 COVID-19: Secondary | ICD-10-CM | POA: Diagnosis not present

## 2023-09-21 DIAGNOSIS — M3500 Sicca syndrome, unspecified: Secondary | ICD-10-CM | POA: Diagnosis not present

## 2023-09-21 DIAGNOSIS — G4733 Obstructive sleep apnea (adult) (pediatric): Secondary | ICD-10-CM | POA: Diagnosis not present

## 2023-09-21 DIAGNOSIS — Z980 Intestinal bypass and anastomosis status: Secondary | ICD-10-CM | POA: Diagnosis not present

## 2023-09-21 DIAGNOSIS — Z86004 Personal history of in-situ neoplasm of other and unspecified digestive organs: Secondary | ICD-10-CM | POA: Diagnosis not present

## 2023-09-21 DIAGNOSIS — K219 Gastro-esophageal reflux disease without esophagitis: Secondary | ICD-10-CM | POA: Diagnosis not present

## 2023-09-22 DIAGNOSIS — G4733 Obstructive sleep apnea (adult) (pediatric): Secondary | ICD-10-CM | POA: Diagnosis not present

## 2023-09-22 DIAGNOSIS — R739 Hyperglycemia, unspecified: Secondary | ICD-10-CM | POA: Diagnosis not present

## 2023-09-29 DIAGNOSIS — R7303 Prediabetes: Secondary | ICD-10-CM | POA: Diagnosis not present

## 2023-09-29 DIAGNOSIS — E669 Obesity, unspecified: Secondary | ICD-10-CM | POA: Diagnosis not present

## 2023-09-29 DIAGNOSIS — G4733 Obstructive sleep apnea (adult) (pediatric): Secondary | ICD-10-CM | POA: Diagnosis not present

## 2023-10-02 DIAGNOSIS — R7303 Prediabetes: Secondary | ICD-10-CM | POA: Diagnosis not present

## 2023-10-23 DIAGNOSIS — G4733 Obstructive sleep apnea (adult) (pediatric): Secondary | ICD-10-CM | POA: Diagnosis not present

## 2023-10-24 DIAGNOSIS — J302 Other seasonal allergic rhinitis: Secondary | ICD-10-CM | POA: Diagnosis not present

## 2023-10-24 DIAGNOSIS — M3500 Sicca syndrome, unspecified: Secondary | ICD-10-CM | POA: Diagnosis not present

## 2023-10-24 DIAGNOSIS — G4733 Obstructive sleep apnea (adult) (pediatric): Secondary | ICD-10-CM | POA: Diagnosis not present

## 2023-10-24 DIAGNOSIS — Z980 Intestinal bypass and anastomosis status: Secondary | ICD-10-CM | POA: Diagnosis not present

## 2023-10-24 DIAGNOSIS — K219 Gastro-esophageal reflux disease without esophagitis: Secondary | ICD-10-CM | POA: Diagnosis not present

## 2023-11-02 DIAGNOSIS — R7303 Prediabetes: Secondary | ICD-10-CM | POA: Diagnosis not present

## 2023-12-02 DIAGNOSIS — E118 Type 2 diabetes mellitus with unspecified complications: Secondary | ICD-10-CM | POA: Diagnosis not present

## 2024-01-02 DIAGNOSIS — E669 Obesity, unspecified: Secondary | ICD-10-CM | POA: Diagnosis not present

## 2024-02-01 DIAGNOSIS — E669 Obesity, unspecified: Secondary | ICD-10-CM | POA: Diagnosis not present

## 2024-03-03 DIAGNOSIS — E669 Obesity, unspecified: Secondary | ICD-10-CM | POA: Diagnosis not present

## 2024-03-26 DIAGNOSIS — M545 Low back pain, unspecified: Secondary | ICD-10-CM | POA: Diagnosis not present

## 2024-03-26 DIAGNOSIS — R768 Other specified abnormal immunological findings in serum: Secondary | ICD-10-CM | POA: Diagnosis not present

## 2024-03-26 DIAGNOSIS — Z6831 Body mass index (BMI) 31.0-31.9, adult: Secondary | ICD-10-CM | POA: Diagnosis not present

## 2024-03-26 DIAGNOSIS — E669 Obesity, unspecified: Secondary | ICD-10-CM | POA: Diagnosis not present

## 2024-03-26 DIAGNOSIS — L439 Lichen planus, unspecified: Secondary | ICD-10-CM | POA: Diagnosis not present

## 2024-04-03 DIAGNOSIS — E669 Obesity, unspecified: Secondary | ICD-10-CM | POA: Diagnosis not present

## 2024-04-16 DIAGNOSIS — Z23 Encounter for immunization: Secondary | ICD-10-CM | POA: Diagnosis not present

## 2024-04-16 DIAGNOSIS — R7303 Prediabetes: Secondary | ICD-10-CM | POA: Diagnosis not present

## 2024-04-16 DIAGNOSIS — G4733 Obstructive sleep apnea (adult) (pediatric): Secondary | ICD-10-CM | POA: Diagnosis not present

## 2024-04-16 DIAGNOSIS — Z7185 Encounter for immunization safety counseling: Secondary | ICD-10-CM | POA: Diagnosis not present

## 2024-04-16 DIAGNOSIS — Z0000 Encounter for general adult medical examination without abnormal findings: Secondary | ICD-10-CM | POA: Diagnosis not present

## 2024-04-17 ENCOUNTER — Encounter: Payer: Self-pay | Admitting: *Deleted

## 2024-04-17 NOTE — Progress Notes (Signed)
 Sherry Russell                                          MRN: [id number]   04/17/2024   The [facility] Quality Team Specialist reviewed this patient medical record for the purposes of chart review for care gap closure. The following were reviewed: chart review for care gap closure-kidney health evaluation for diabetes:eGFR  and uACR.    [facility]BCI Quality Team

## 2024-05-03 DIAGNOSIS — E669 Obesity, unspecified: Secondary | ICD-10-CM | POA: Diagnosis not present

## 2024-05-06 ENCOUNTER — Other Ambulatory Visit (HOSPITAL_COMMUNITY): Payer: Self-pay

## 2024-06-03 DIAGNOSIS — E669 Obesity, unspecified: Secondary | ICD-10-CM | POA: Diagnosis not present

## 2024-06-20 DIAGNOSIS — Z1231 Encounter for screening mammogram for malignant neoplasm of breast: Secondary | ICD-10-CM | POA: Diagnosis not present

## 2024-06-20 DIAGNOSIS — Z01419 Encounter for gynecological examination (general) (routine) without abnormal findings: Secondary | ICD-10-CM | POA: Diagnosis not present

## 2024-07-01 ENCOUNTER — Other Ambulatory Visit: Payer: Self-pay | Admitting: Obstetrics & Gynecology

## 2024-07-01 DIAGNOSIS — R928 Other abnormal and inconclusive findings on diagnostic imaging of breast: Secondary | ICD-10-CM

## 2024-07-03 DIAGNOSIS — E669 Obesity, unspecified: Secondary | ICD-10-CM | POA: Diagnosis not present

## 2024-07-12 ENCOUNTER — Ambulatory Visit

## 2024-07-12 ENCOUNTER — Ambulatory Visit
Admission: RE | Admit: 2024-07-12 | Discharge: 2024-07-12 | Disposition: A | Discharge status: Home / Self Care | Source: Ambulatory Visit | Attending: Obstetrics & Gynecology | Admitting: Obstetrics & Gynecology

## 2024-07-12 DIAGNOSIS — R928 Other abnormal and inconclusive findings on diagnostic imaging of breast: Secondary | ICD-10-CM | POA: Diagnosis not present
